# Patient Record
Sex: Female | Born: 1945 | Race: White | Hispanic: No | Marital: Single
Health system: Southern US, Community
[De-identification: ages and names within clinical notes are randomized; demographics above are authoritative.]

## PROBLEM LIST (undated history)

## (undated) DIAGNOSIS — F0280 Dementia in other diseases classified elsewhere without behavioral disturbance: Secondary | ICD-10-CM

## (undated) DIAGNOSIS — N289 Disorder of kidney and ureter, unspecified: Secondary | ICD-10-CM

## (undated) DIAGNOSIS — I10 Essential (primary) hypertension: Secondary | ICD-10-CM

## (undated) DIAGNOSIS — R6339 Other feeding difficulties: Secondary | ICD-10-CM

## (undated) DIAGNOSIS — G309 Alzheimer's disease, unspecified: Secondary | ICD-10-CM

## (undated) DIAGNOSIS — R633 Feeding difficulties: Secondary | ICD-10-CM

## (undated) DIAGNOSIS — M6281 Muscle weakness (generalized): Secondary | ICD-10-CM

## (undated) HISTORY — PX: ABDOMINAL HYSTERECTOMY: SHX81

## (undated) HISTORY — PX: BACK SURGERY: SHX140

## (undated) HISTORY — PX: JOINT REPLACEMENT: SHX530

---

## 2016-05-19 ENCOUNTER — Inpatient Hospital Stay (HOSPITAL_COMMUNITY)
Admission: EM | Admit: 2016-05-19 | Discharge: 2016-05-21 | DRG: 470 | Disposition: A | Discharge status: Skilled Nursing Facility | Payer: Medicare Other | Attending: Internal Medicine | Admitting: Internal Medicine

## 2016-05-19 ENCOUNTER — Emergency Department (HOSPITAL_COMMUNITY): Payer: Medicare Other

## 2016-05-19 ENCOUNTER — Encounter (HOSPITAL_COMMUNITY): Payer: Self-pay | Admitting: Emergency Medicine

## 2016-05-19 DIAGNOSIS — E440 Moderate protein-calorie malnutrition: Secondary | ICD-10-CM | POA: Diagnosis present

## 2016-05-19 DIAGNOSIS — I10 Essential (primary) hypertension: Secondary | ICD-10-CM | POA: Diagnosis present

## 2016-05-19 DIAGNOSIS — F418 Other specified anxiety disorders: Secondary | ICD-10-CM | POA: Diagnosis present

## 2016-05-19 DIAGNOSIS — F0280 Dementia in other diseases classified elsewhere without behavioral disturbance: Secondary | ICD-10-CM | POA: Diagnosis present

## 2016-05-19 DIAGNOSIS — F0390 Unspecified dementia without behavioral disturbance: Secondary | ICD-10-CM | POA: Diagnosis present

## 2016-05-19 DIAGNOSIS — Z7984 Long term (current) use of oral hypoglycemic drugs: Secondary | ICD-10-CM

## 2016-05-19 DIAGNOSIS — M25551 Pain in right hip: Secondary | ICD-10-CM | POA: Diagnosis present

## 2016-05-19 DIAGNOSIS — G309 Alzheimer's disease, unspecified: Secondary | ICD-10-CM | POA: Diagnosis present

## 2016-05-19 DIAGNOSIS — S72001A Fracture of unspecified part of neck of right femur, initial encounter for closed fracture: Secondary | ICD-10-CM | POA: Diagnosis not present

## 2016-05-19 DIAGNOSIS — W19XXXA Unspecified fall, initial encounter: Secondary | ICD-10-CM | POA: Diagnosis not present

## 2016-05-19 DIAGNOSIS — Z01818 Encounter for other preprocedural examination: Secondary | ICD-10-CM | POA: Insufficient documentation

## 2016-05-19 DIAGNOSIS — M6281 Muscle weakness (generalized): Secondary | ICD-10-CM

## 2016-05-19 DIAGNOSIS — G308 Other Alzheimer's disease: Secondary | ICD-10-CM | POA: Diagnosis not present

## 2016-05-19 DIAGNOSIS — Z6821 Body mass index (BMI) 21.0-21.9, adult: Secondary | ICD-10-CM

## 2016-05-19 DIAGNOSIS — S72011A Unspecified intracapsular fracture of right femur, initial encounter for closed fracture: Secondary | ICD-10-CM | POA: Diagnosis present

## 2016-05-19 DIAGNOSIS — E119 Type 2 diabetes mellitus without complications: Secondary | ICD-10-CM | POA: Diagnosis present

## 2016-05-19 DIAGNOSIS — W1830XA Fall on same level, unspecified, initial encounter: Secondary | ICD-10-CM | POA: Diagnosis present

## 2016-05-19 DIAGNOSIS — R2689 Other abnormalities of gait and mobility: Secondary | ICD-10-CM

## 2016-05-19 DIAGNOSIS — F0281 Dementia in other diseases classified elsewhere with behavioral disturbance: Secondary | ICD-10-CM

## 2016-05-19 DIAGNOSIS — Z9889 Other specified postprocedural states: Secondary | ICD-10-CM

## 2016-05-19 HISTORY — DX: Feeding difficulties: R63.3

## 2016-05-19 HISTORY — DX: Disorder of kidney and ureter, unspecified: N28.9

## 2016-05-19 HISTORY — DX: Dementia in other diseases classified elsewhere, unspecified severity, without behavioral disturbance, psychotic disturbance, mood disturbance, and anxiety: F02.80

## 2016-05-19 HISTORY — DX: Muscle weakness (generalized): M62.81

## 2016-05-19 HISTORY — DX: Alzheimer's disease, unspecified: G30.9

## 2016-05-19 HISTORY — DX: Other feeding difficulties: R63.39

## 2016-05-19 HISTORY — DX: Essential (primary) hypertension: I10

## 2016-05-19 LAB — SURGICAL PCR SCREEN
MRSA, PCR: NEGATIVE
Staphylococcus aureus: NEGATIVE

## 2016-05-19 LAB — CBC WITH DIFFERENTIAL/PLATELET
BASOS ABS: 0 10*3/uL (ref 0.0–0.1)
Basophils Relative: 0 %
EOS ABS: 0 10*3/uL (ref 0.0–0.7)
Eosinophils Relative: 0 %
HCT: 37.6 % (ref 36.0–46.0)
HEMOGLOBIN: 12.4 g/dL (ref 12.0–15.0)
LYMPHS ABS: 0.3 10*3/uL — AB (ref 0.7–4.0)
LYMPHS PCT: 4 %
MCH: 29.5 pg (ref 26.0–34.0)
MCHC: 33 g/dL (ref 30.0–36.0)
MCV: 89.5 fL (ref 78.0–100.0)
Monocytes Absolute: 0.7 10*3/uL (ref 0.1–1.0)
Monocytes Relative: 9 %
NEUTROS PCT: 87 %
Neutro Abs: 6.7 10*3/uL (ref 1.7–7.7)
PLATELETS: 152 10*3/uL (ref 150–400)
RBC: 4.2 MIL/uL (ref 3.87–5.11)
RDW: 13.3 % (ref 11.5–15.5)
WBC: 7.7 10*3/uL (ref 4.0–10.5)

## 2016-05-19 LAB — BASIC METABOLIC PANEL
Anion gap: 8 (ref 5–15)
BUN: 21 mg/dL — ABNORMAL HIGH (ref 6–20)
CHLORIDE: 98 mmol/L — AB (ref 101–111)
CO2: 29 mmol/L (ref 22–32)
CREATININE: 0.63 mg/dL (ref 0.44–1.00)
Calcium: 8.9 mg/dL (ref 8.9–10.3)
Glucose, Bld: 158 mg/dL — ABNORMAL HIGH (ref 65–99)
POTASSIUM: 3.6 mmol/L (ref 3.5–5.1)
SODIUM: 135 mmol/L (ref 135–145)

## 2016-05-19 LAB — GLUCOSE, CAPILLARY: Glucose-Capillary: 106 mg/dL — ABNORMAL HIGH (ref 65–99)

## 2016-05-19 LAB — PROTIME-INR
INR: 1.11
PROTHROMBIN TIME: 14.3 s (ref 11.4–15.2)

## 2016-05-19 LAB — CALCIUM: CALCIUM: 8.9 mg/dL (ref 8.9–10.3)

## 2016-05-19 IMAGING — DX DG HIP (WITH OR WITHOUT PELVIS) 2-3V*R*
3 series · 3 of 3 positions shown · non-contrast
Comparison: None.

CLINICAL DATA: Fall

EXAM:
DG HIP (WITH OR WITHOUT PELVIS) 2-3V RIGHT

[hip ap]
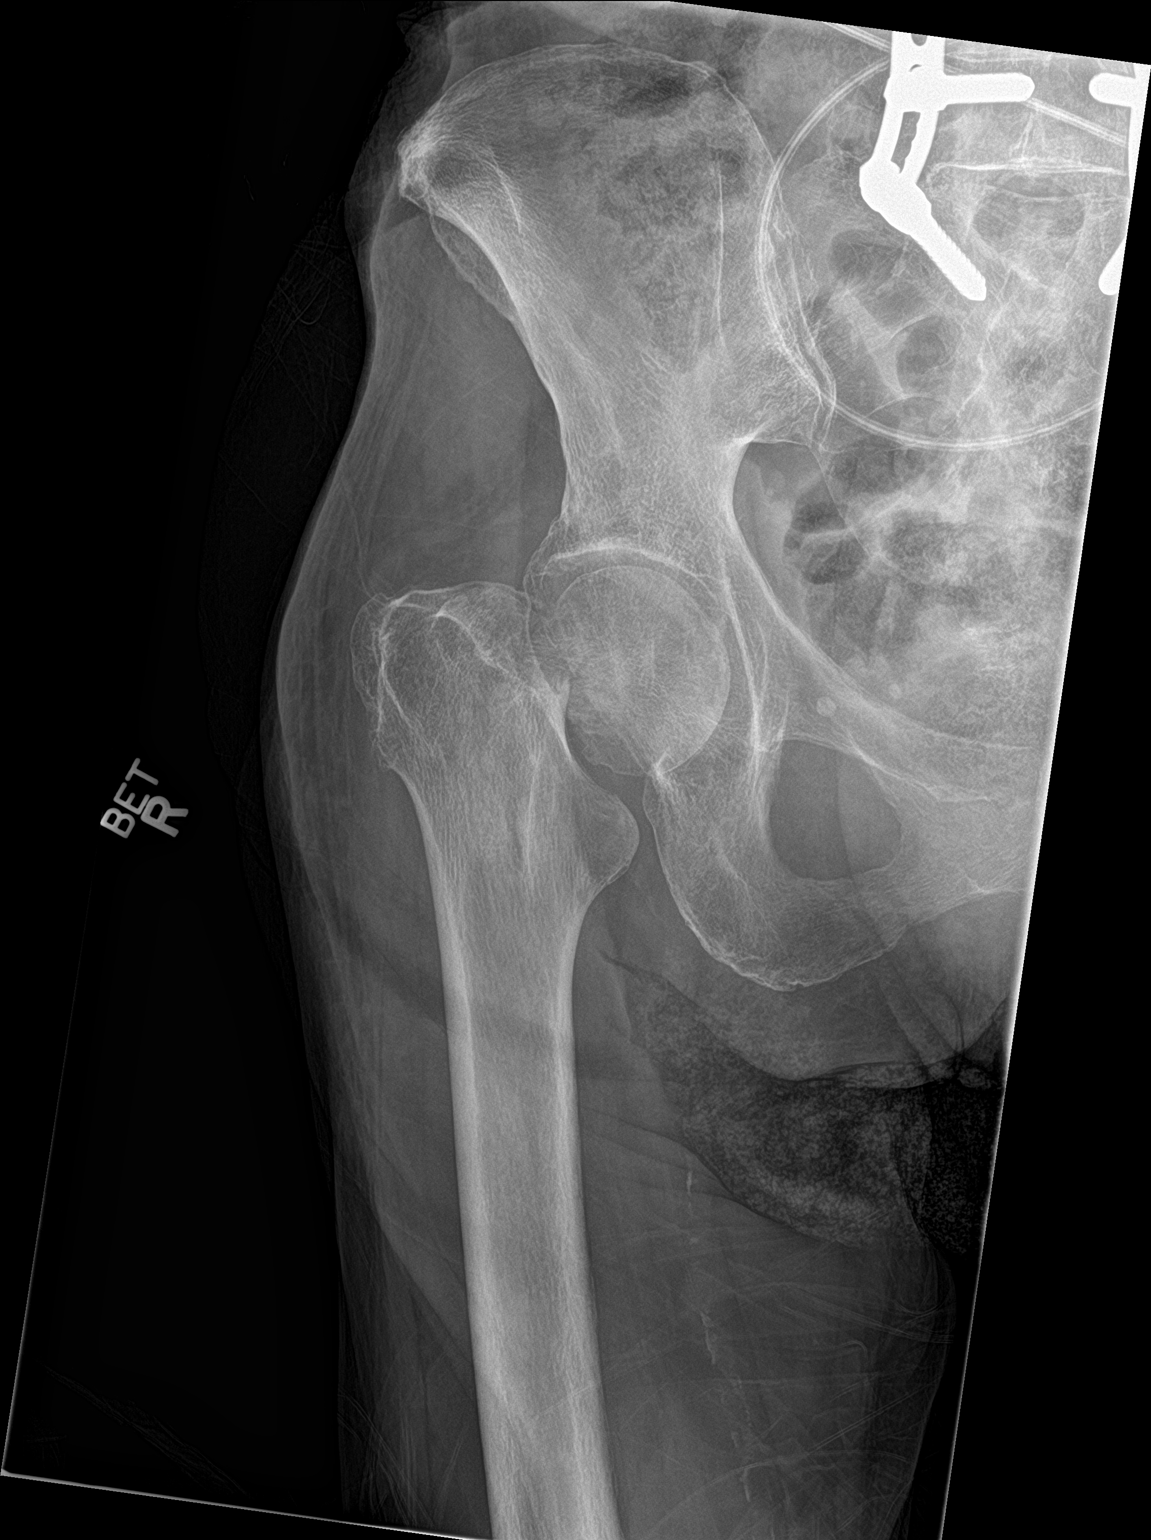

[hip lat]
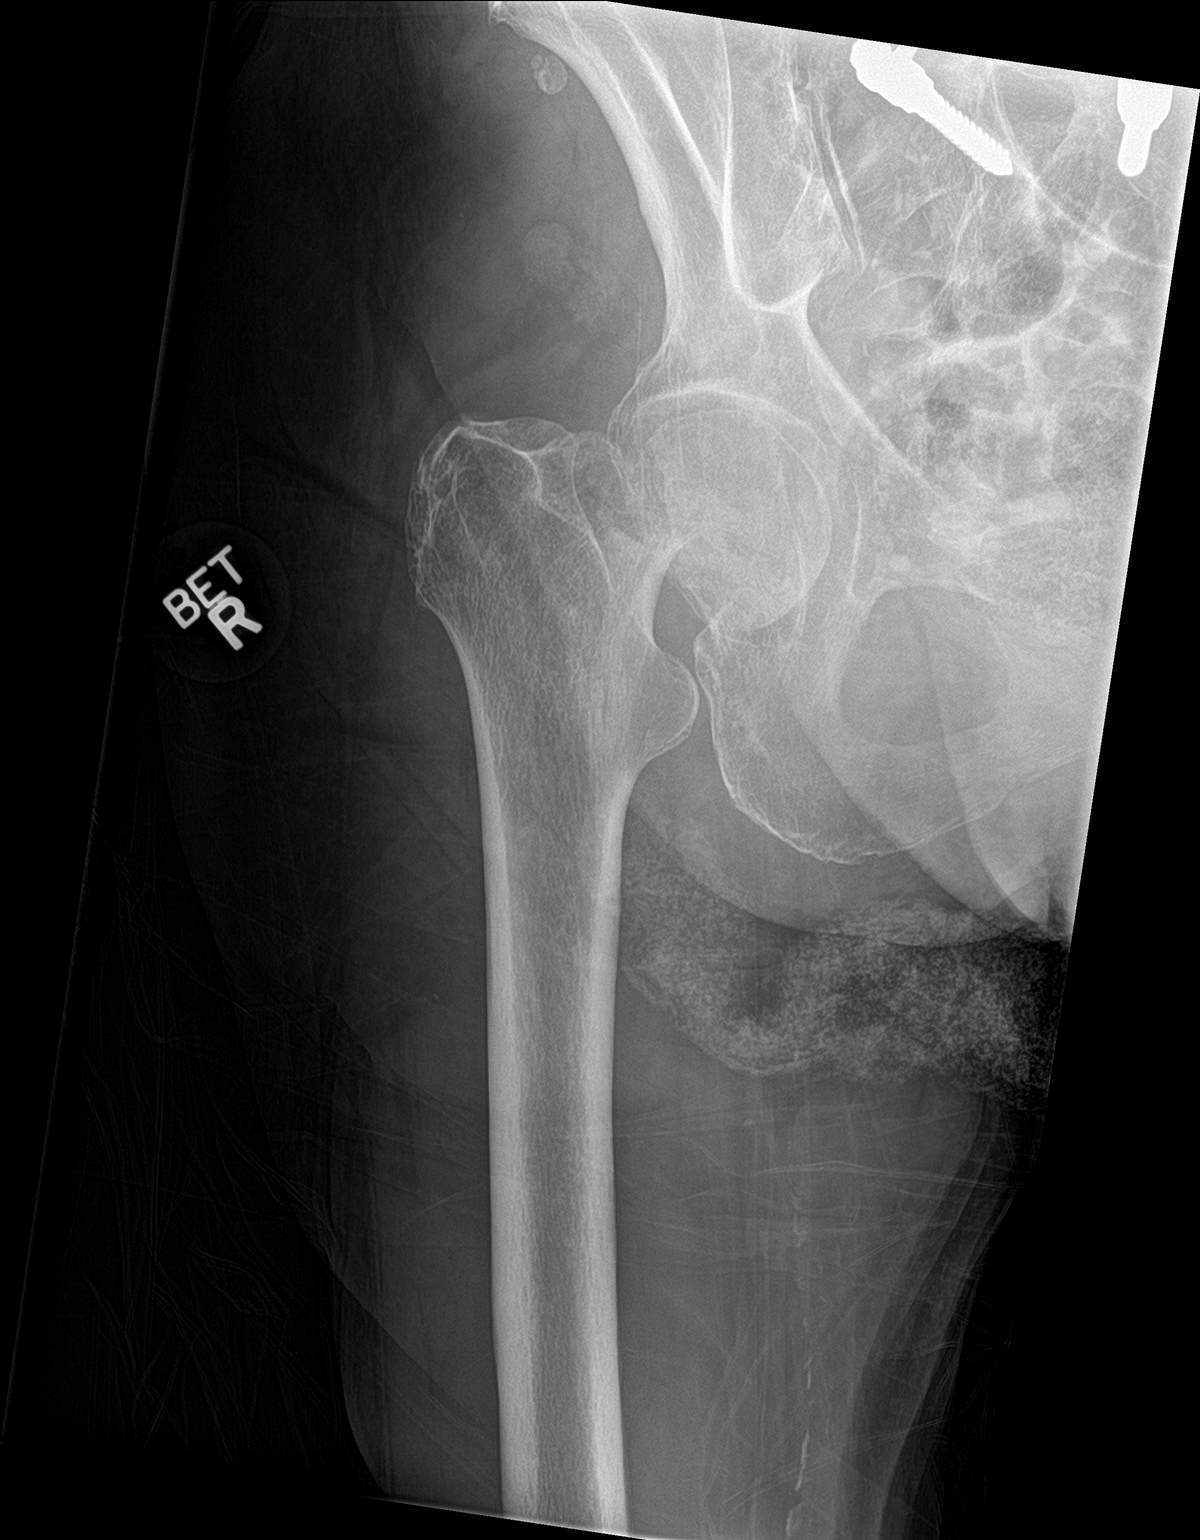

[pelvis ap]
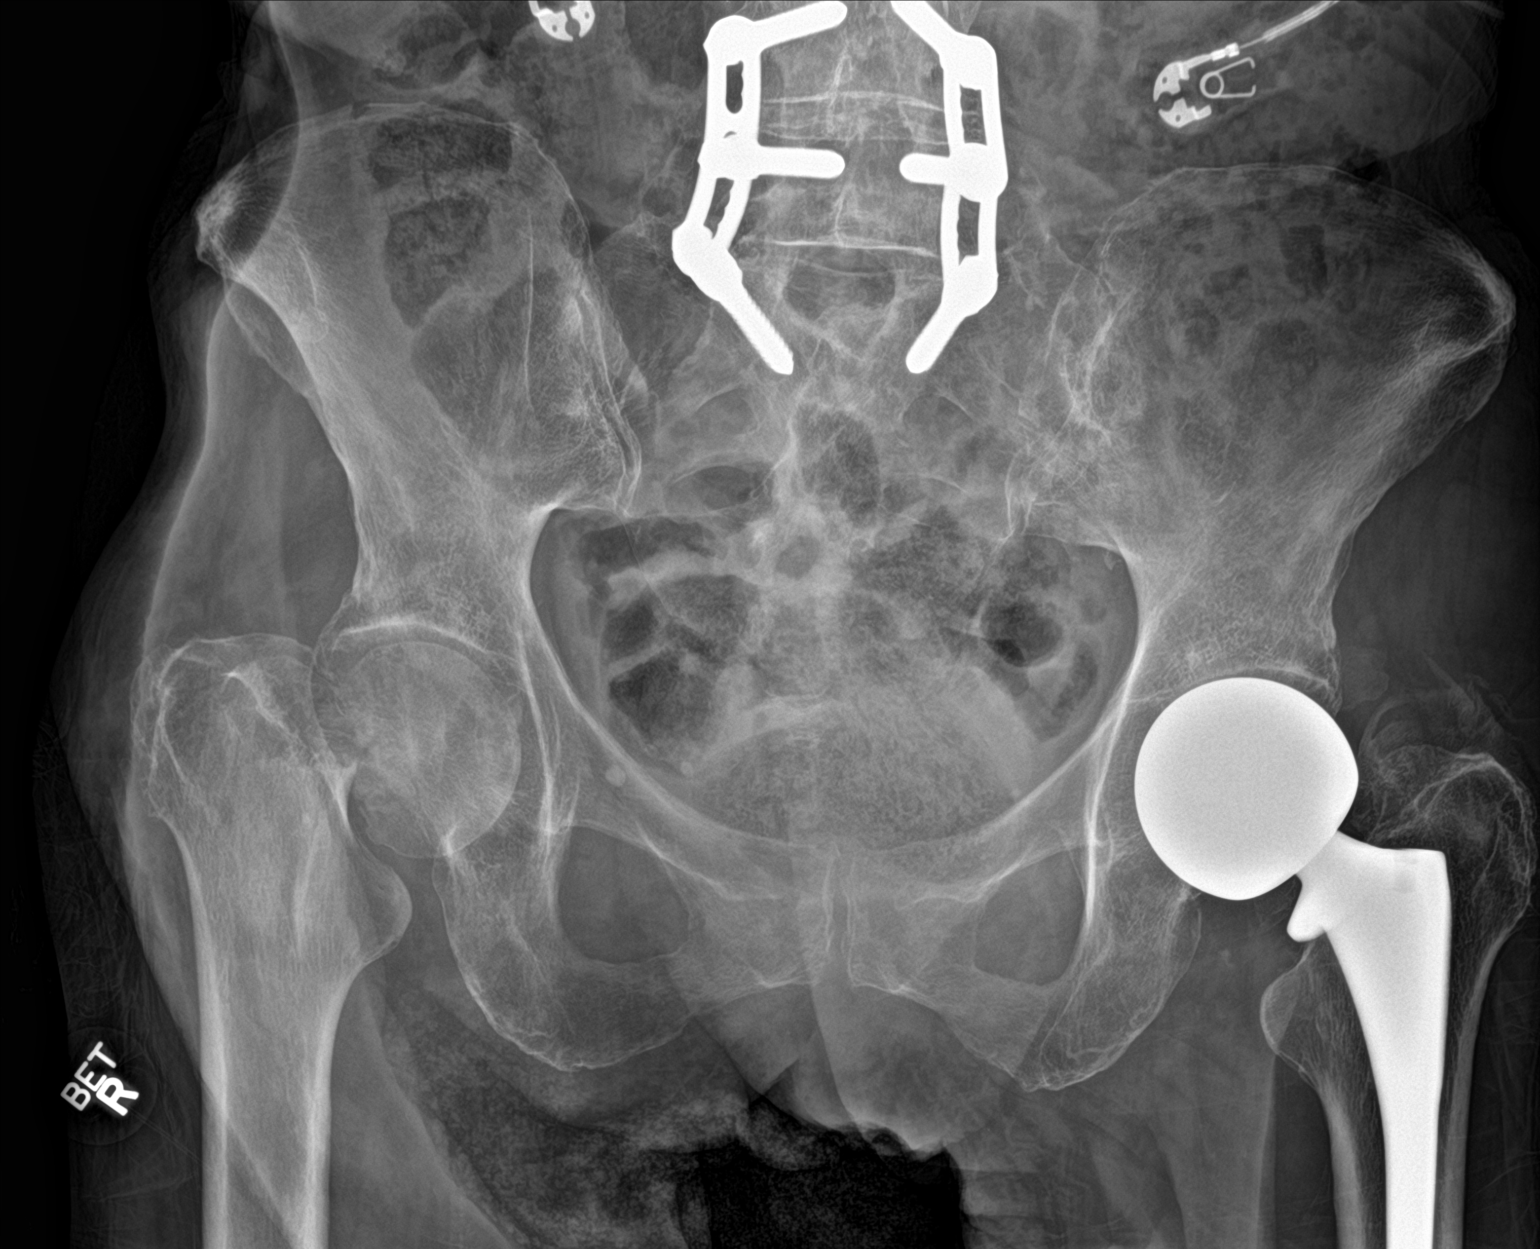

[3 of 3 positions shown; findings below may reference images not displayed]

FINDINGS: There is a markedly displaced subcapital fracture of the right
femoral neck. There is foreshortening of the femur. Osteopenia. Left
hip hemiarthroplasty is in place. Lumbosacral fusion hardware is in
place. Osteopenia.
IMPRESSION: Markedly displaced acute right femoral neck fracture.

## 2016-05-19 IMAGING — DX DG CHEST 1V
1 series · 1 of 1 positions shown · non-contrast
Comparison: None available for comparison at time of study
interpretation.

CLINICAL DATA: Fell last night, RIGHT hip injury. Fell again today
with the RIGHT leg pain in shortening.

EXAM:
CHEST 1 VIEW

[chest pa]
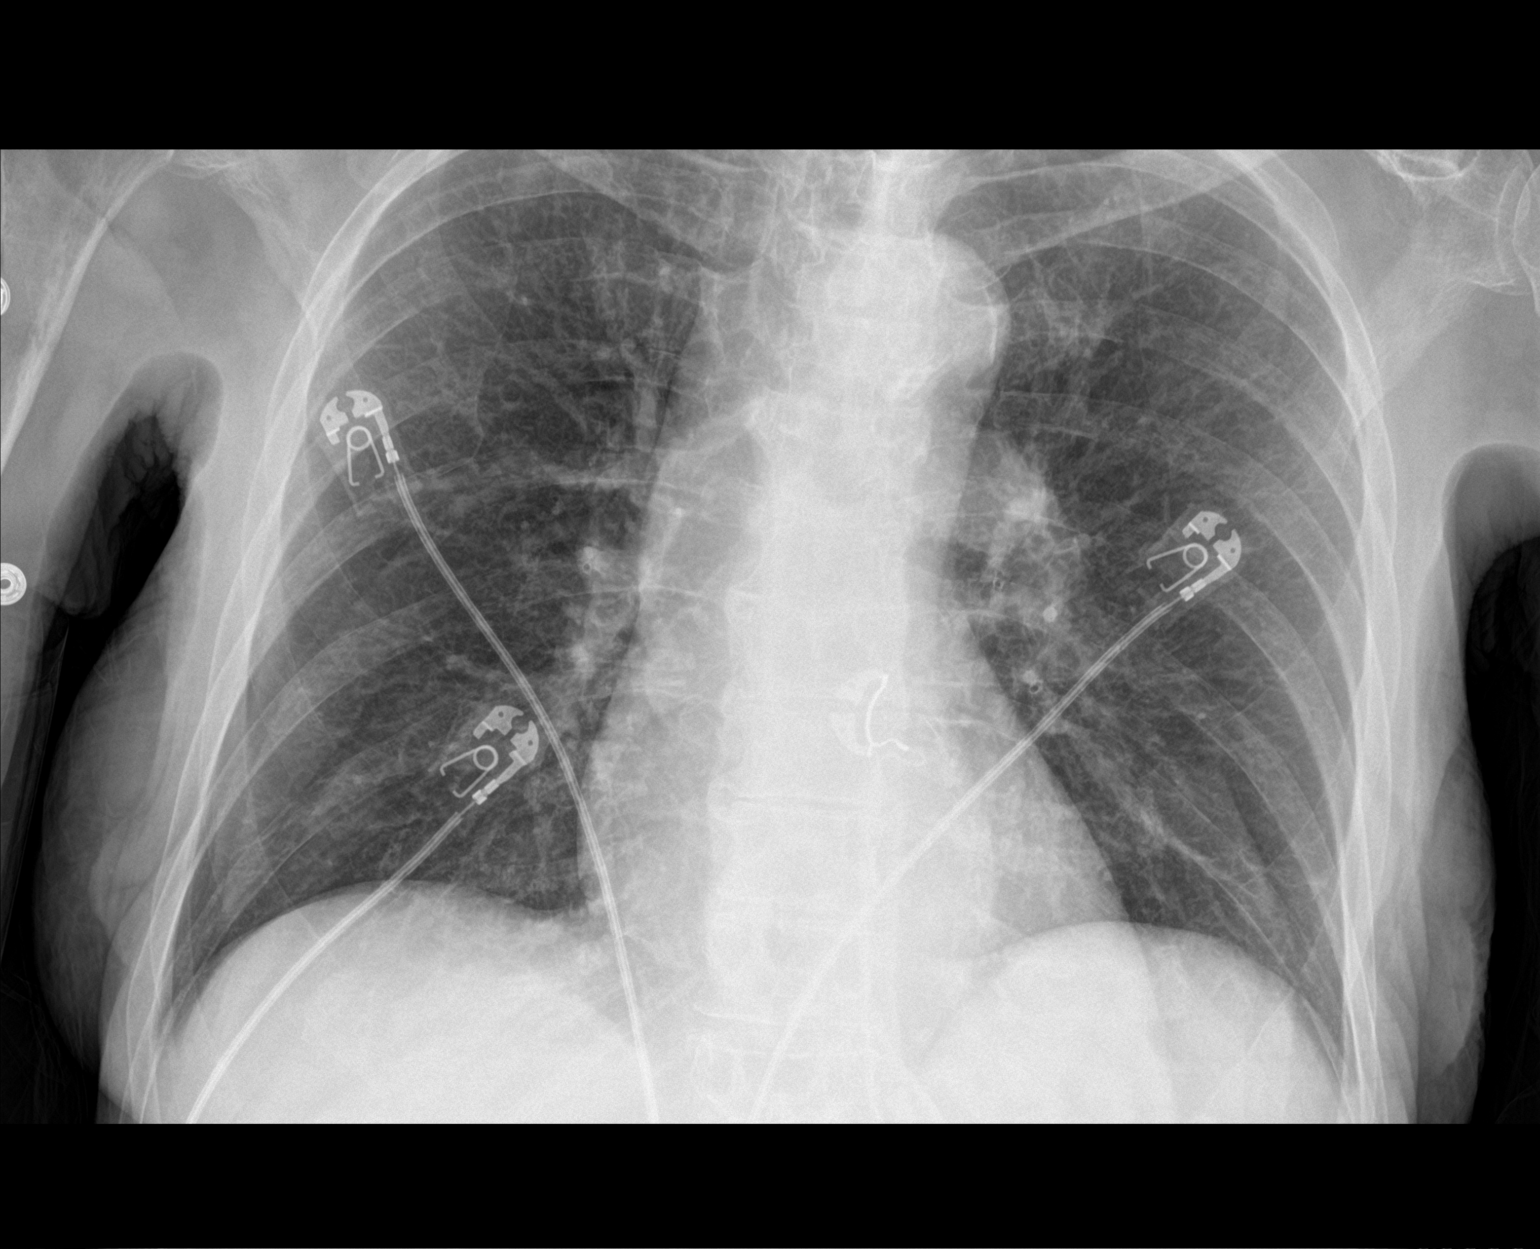

[1 of 1 positions shown; findings below may reference images not displayed]

FINDINGS: Cardiac silhouette is normal. Fullness of LEFT hilum accentuated by
rotation to the RIGHT. Calcified aortic knob. No pleural effusions
or focal consolidations. Trachea projects midline and there is no
pneumothorax. Soft tissue planes and included osseous structures are
non-suspicious.
IMPRESSION: No acute cardiopulmonary process.

Prominent LEFT hilum which could represent vascular structures
and/or lymphadenopathy.

## 2016-05-19 MED ORDER — SODIUM CHLORIDE 0.9 % IV SOLN
INTRAVENOUS | Status: DC
  Administered 2016-05-19: 21:00:00 via INTRAVENOUS

## 2016-05-19 MED ORDER — VITAMIN D 1000 UNITS PO TABS
3000.0000 [IU] | ORAL_TABLET | Freq: Every day | ORAL | Status: DC
  Administered 2016-05-20: 3000 [IU] via ORAL
  Filled 2016-05-19 (×4): qty 3

## 2016-05-19 MED ORDER — DIVALPROEX SODIUM 125 MG PO CSDR
125.0000 mg | DELAYED_RELEASE_CAPSULE | Freq: Two times a day (BID) | ORAL | Status: DC
  Administered 2016-05-20 – 2016-05-21 (×3): 125 mg via ORAL
  Filled 2016-05-19 (×6): qty 1

## 2016-05-19 MED ORDER — SODIUM CHLORIDE 0.9 % IV SOLN: Freq: Once | INTRAVENOUS | Status: DC

## 2016-05-19 MED ORDER — DONEPEZIL HCL 5 MG PO TABS
10.0000 mg | ORAL_TABLET | Freq: Every day | ORAL | Status: DC
  Administered 2016-05-19 – 2016-05-20 (×2): 10 mg via ORAL
  Filled 2016-05-19 (×2): qty 2

## 2016-05-19 MED ORDER — HYDRALAZINE HCL 20 MG/ML IJ SOLN: 5.0000 mg | INTRAMUSCULAR | Status: DC | PRN

## 2016-05-19 MED ORDER — INSULIN ASPART 100 UNIT/ML ~~LOC~~ SOLN
0.0000 [IU] | SUBCUTANEOUS | Status: DC
  Administered 2016-05-19 – 2016-05-21 (×4): 2 [IU] via SUBCUTANEOUS
  Administered 2016-05-21: 1 [IU] via SUBCUTANEOUS

## 2016-05-19 MED ORDER — HYDROCODONE-ACETAMINOPHEN 5-325 MG PO TABS: 1.0000 | ORAL_TABLET | Freq: Four times a day (QID) | ORAL | Status: DC | PRN

## 2016-05-19 MED ORDER — DEXTROSE 5 % IV SOLN
500.0000 mg | Freq: Four times a day (QID) | INTRAVENOUS | Status: DC | PRN
  Filled 2016-05-19: qty 5

## 2016-05-19 MED ORDER — SODIUM CHLORIDE 0.9 % IV SOLN
Freq: Once | INTRAVENOUS | Status: AC
  Administered 2016-05-19: 17:00:00 via INTRAVENOUS

## 2016-05-19 MED ORDER — DOCUSATE SODIUM 100 MG PO CAPS
100.0000 mg | ORAL_CAPSULE | Freq: Two times a day (BID) | ORAL | Status: DC
  Administered 2016-05-19 – 2016-05-21 (×4): 100 mg via ORAL
  Filled 2016-05-19 (×4): qty 1

## 2016-05-19 MED ORDER — MEMANTINE HCL 10 MG PO TABS
10.0000 mg | ORAL_TABLET | Freq: Two times a day (BID) | ORAL | Status: DC
  Administered 2016-05-19 – 2016-05-21 (×4): 10 mg via ORAL
  Filled 2016-05-19 (×4): qty 1

## 2016-05-19 MED ORDER — SERTRALINE HCL 50 MG PO TABS
100.0000 mg | ORAL_TABLET | Freq: Every day | ORAL | Status: DC
Start: 2016-05-20 — End: 2016-05-21
  Administered 2016-05-20 – 2016-05-21 (×2): 100 mg via ORAL
  Filled 2016-05-19 (×2): qty 2

## 2016-05-19 MED ORDER — HEPARIN SODIUM (PORCINE) 5000 UNIT/ML IJ SOLN
5000.0000 [IU] | Freq: Three times a day (TID) | INTRAMUSCULAR | Status: AC
  Administered 2016-05-19: 5000 [IU] via SUBCUTANEOUS
  Filled 2016-05-19: qty 1

## 2016-05-19 MED ORDER — LORAZEPAM 2 MG/ML IJ SOLN
0.5000 mg | INTRAMUSCULAR | Status: DC | PRN
  Administered 2016-05-21: 0.5 mg via INTRAVENOUS
  Filled 2016-05-19: qty 1

## 2016-05-19 MED ORDER — METHOCARBAMOL 500 MG PO TABS: 500.0000 mg | ORAL_TABLET | Freq: Four times a day (QID) | ORAL | Status: DC | PRN

## 2016-05-19 MED ORDER — MORPHINE SULFATE (PF) 2 MG/ML IV SOLN
0.5000 mg | INTRAVENOUS | Status: DC | PRN
Start: 2016-05-19 — End: 2016-05-21
  Administered 2016-05-20 – 2016-05-21 (×3): 0.5 mg via INTRAVENOUS
  Filled 2016-05-19 (×3): qty 1

## 2016-05-19 MED ORDER — FENTANYL CITRATE (PF) 100 MCG/2ML IJ SOLN
50.0000 ug | INTRAMUSCULAR | Status: DC | PRN
  Administered 2016-05-19: 50 ug via INTRAVENOUS
  Filled 2016-05-19: qty 2

## 2016-05-19 MED ORDER — ALPRAZOLAM 0.25 MG PO TABS: 0.2500 mg | ORAL_TABLET | Freq: Three times a day (TID) | ORAL | Status: DC | PRN

## 2016-05-19 NOTE — ED Notes (Signed)
Pt c/o pain in r leg.  Notified edp and meds ordered.  Pt's depends, wet, pt cleaned.

## 2016-05-19 NOTE — ED Triage Notes (Signed)
Pt seen at [facility] last night for a fall and R hip injury. Pt was dx with contusion per EMS. Pt fell again today and has external rotation and shortening of the R leg.

## 2016-05-19 NOTE — ED Provider Notes (Signed)
[facility]-EMERGENCY DEPT Provider Note   CSN: [id number] Arrival date & time: 05/19/16  1544     History   Chief Complaint Chief Complaint  Patient presents with  . Fall    HPI [name] is a 70 y.o. female.  Patient with history of dementia Alzheimer's, high blood pressure, weakness, [facility] presents after unwitnessed fall with shortened and rotated right leg. Unknown details. No family at bedside. Patient reportedly at baseline.      Past Medical History:  Diagnosis Date  . Alzheimer's dementia   . Feeding problems   . Hypertension   . Muscle weakness   . Renal disorder     Patient Active Problem List   Diagnosis Date Noted  . Dementia 05/19/2016  . Hypertension 05/19/2016  . Closed displaced fracture of right femoral neck (HCC) 05/19/2016  . Depression with anxiety 05/19/2016  . Non-insulin dependent type 2 diabetes mellitus (HCC) 05/19/2016  . Closed right hip fracture, initial encounter (HCC) 05/19/2016    History reviewed. No pertinent surgical history.  OB History    No data available       Home Medications    Prior to Admission medications   Medication Sig Start Date End Date Taking? Authorizing Provider  cholecalciferol (VITAMIN D) 1000 units tablet Take 3,000 Units by mouth daily.   Yes Historical Provider, MD  divalproex (DEPAKOTE SPRINKLE) 125 MG capsule Take 125 mg by mouth 2 (two) times daily. 05/02/16  Yes Historical Provider, MD  donepezil (ARICEPT) 10 MG tablet Take 10 mg by mouth at bedtime. 05/10/16  Yes Historical Provider, MD  Lactobacillus Rhamnosus, GG, (CULTURELLE) CAPS Take 1 capsule by mouth daily.   Yes Historical Provider, MD  lisinopril (PRINIVIL,ZESTRIL) 5 MG tablet Take 5 mg by mouth daily. 05/06/16  Yes Historical Provider, MD  memantine (NAMENDA) 10 MG tablet Take 10 mg by mouth 2 (two) times daily. 05/13/16  Yes Historical Provider, MD  metFORMIN (GLUCOPHAGE) 500 MG tablet Take 500 mg by mouth 2 (two) times  daily. 04/28/16  Yes Historical Provider, MD  mupirocin ointment (BACTROBAN) 2 % Apply 1 application topically every 12 (twelve) hours. 05/17/16  Yes Historical Provider, MD  sertraline (ZOLOFT) 100 MG tablet Take 100 mg by mouth daily. 05/12/16  Yes Historical Provider, MD  ALPRAZolam Prudy Feeler(XANAX) 0.25 MG tablet Take 0.25 mg by mouth every 8 (eight) hours as needed for anxiety.  04/30/16   Historical Provider, MD    Family History Family History  Problem Relation Age of Onset  . Family history unknown: Yes    Social History Social History  Substance Use Topics  . Smoking status: Unknown If Ever Smoked  . Smokeless tobacco: Not on file  . Alcohol use No     Allergies   Patient has no known allergies.   Review of Systems Review of Systems  Unable to perform ROS: Dementia     Physical Exam Updated Vital Signs BP 158/80 (BP Location: Right Arm)   Pulse 91   Temp 99.9 F (37.7 C) (Oral)   Resp 18   Ht 5\' 3"  (1.6 m)   Wt 125 lb (56.7 kg)   SpO2 98%   BMI 22.14 kg/m   Physical Exam  Constitutional: She appears well-developed. No distress.  HENT:  Head: Normocephalic and atraumatic.  Eyes: Pupils are equal, round, and reactive to light.  Neck: Normal range of motion. Neck supple.  Cardiovascular: Normal rate.   Pulmonary/Chest: Effort normal and breath sounds normal.  Abdominal: Soft.  There is no tenderness.  Musculoskeletal: She exhibits tenderness and deformity.  Patient has shortened Rotated right extremity 2+ distal pulses no edema to the right leg tenderness with external rotation.  Neurological: She is alert.  Skin: Skin is warm. Capillary refill takes less than 2 seconds.  Psychiatric:  Pleasant dementia  Nursing note and vitals reviewed.    ED Treatments / Results  Labs (all labs ordered are listed, but only abnormal results are displayed) Labs Reviewed  BASIC METABOLIC PANEL - Abnormal; Notable for the following:       Result Value   Chloride 98 (*)      Glucose, Bld 158 (*)    BUN 21 (*)    All other components within normal limits  CBC WITH DIFFERENTIAL/PLATELET - Abnormal; Notable for the following:    Lymphs Abs 0.3 (*)    All other components within normal limits  PROTIME-INR  CBC  CREATININE, SERUM  URINALYSIS, ROUTINE W REFLEX MICROSCOPIC (NOT AT [facility])  CALCIUM  VITAMIN D 25 HYDROXY (VIT D DEFICIENCY, FRACTURES)  BASIC METABOLIC PANEL  TYPE AND SCREEN    EKG  EKG Interpretation None       Radiology Dg Chest 1 View  Result Date: 05/19/2016 CLINICAL DATA:  Larey SeatFell last night, RIGHT hip injury. Larey SeatFell again today with the RIGHT leg pain in shortening. EXAM: CHEST 1 VIEW COMPARISON:  None available for comparison at time of study interpretation. FINDINGS: Cardiac silhouette is normal. Fullness of LEFT hilum accentuated by rotation to the RIGHT. Calcified aortic knob. No pleural effusions or focal consolidations. Trachea projects midline and there is no pneumothorax. Soft tissue planes and included osseous structures are non-suspicious. IMPRESSION: No acute cardiopulmonary process. Prominent LEFT hilum which could represent vascular structures and/or lymphadenopathy. Electronically Signed   By: Awilda Metroourtnay  Bloomer M.D.   On: 05/19/2016 17:58   Dg Hip Unilat With Pelvis 2-3 Views Right  Result Date: 05/19/2016 CLINICAL DATA:  Fall EXAM: DG HIP (WITH OR WITHOUT PELVIS) 2-3V RIGHT COMPARISON:  None. FINDINGS: There is a markedly displaced subcapital fracture of the right femoral neck. There is foreshortening of the femur. Osteopenia. Left hip hemiarthroplasty is in place. Lumbosacral fusion hardware is in place. Osteopenia. IMPRESSION: Markedly displaced acute right femoral neck fracture. Electronically Signed   By: Jolaine ClickArthur  Hoss M.D.   On: 05/19/2016 17:51    Procedures Procedures (including critical care time)  Medications Ordered in ED Medications  ALPRAZolam (XANAX) tablet 0.25 mg (not administered)  cholecalciferol (VITAMIN D)  tablet 3,000 Units (not administered)  divalproex (DEPAKOTE SPRINKLE) capsule 125 mg (not administered)  donepezil (ARICEPT) tablet 10 mg (not administered)  memantine (NAMENDA) tablet 10 mg (not administered)  sertraline (ZOLOFT) tablet 100 mg (not administered)  HYDROcodone-acetaminophen (NORCO/VICODIN) 5-325 MG per tablet 1-2 tablet (not administered)  morphine 2 MG/ML injection 0.5 mg (not administered)  insulin aspart (novoLOG) injection 0-9 Units (not administered)  heparin injection 5,000 Units (not administered)  methocarbamol (ROBAXIN) tablet 500 mg (not administered)    Or  methocarbamol (ROBAXIN) 500 mg in dextrose 5 % 50 mL IVPB (not administered)  docusate sodium (COLACE) capsule 100 mg (not administered)  LORazepam (ATIVAN) injection 0.5 mg (not administered)  hydrALAZINE (APRESOLINE) injection 5 mg (not administered)  0.9 %  sodium chloride infusion (not administered)  0.9 %  sodium chloride infusion (0 mL/hr Intravenous Stopped 05/19/16 1826)     Initial Impression / Assessment and Plan / ED Course  I have reviewed the triage vital signs and the nursing  notes.  Pertinent labs & imaging results that were available during my care of the patient were reviewed by me and considered in my medical decision making (see chart for details).  Clinical Course    Patient resents after unwitnessed fall at baseline per dementia report. Concern clinically for right hip fracture/dislocation. Plan for x-ray, blood work and likely admission with Ortho consult. Spoke with Dr. Lajoyce Corners recommended triad admission and stated [facility]. The patients results and plan were reviewed and discussed.   Any x-rays performed were independently reviewed by myself.   Differential diagnosis were considered with the presenting HPI.  Medications  ALPRAZolam (XANAX) tablet 0.25 mg (not administered)  cholecalciferol (VITAMIN D) tablet 3,000 Units (not administered)  divalproex (DEPAKOTE SPRINKLE)  capsule 125 mg (not administered)  donepezil (ARICEPT) tablet 10 mg (not administered)  memantine (NAMENDA) tablet 10 mg (not administered)  sertraline (ZOLOFT) tablet 100 mg (not administered)  HYDROcodone-acetaminophen (NORCO/VICODIN) 5-325 MG per tablet 1-2 tablet (not administered)  morphine 2 MG/ML injection 0.5 mg (not administered)  insulin aspart (novoLOG) injection 0-9 Units (not administered)  heparin injection 5,000 Units (not administered)  methocarbamol (ROBAXIN) tablet 500 mg (not administered)    Or  methocarbamol (ROBAXIN) 500 mg in dextrose 5 % 50 mL IVPB (not administered)  docusate sodium (COLACE) capsule 100 mg (not administered)  LORazepam (ATIVAN) injection 0.5 mg (not administered)  hydrALAZINE (APRESOLINE) injection 5 mg (not administered)  0.9 %  sodium chloride infusion (not administered)  0.9 %  sodium chloride infusion (0 mL/hr Intravenous Stopped 05/19/16 1826)    Vitals:   05/19/16 1551 05/19/16 1554 05/19/16 1809  BP:  154/80 158/80  Pulse:  85 91  Resp:  20 18  Temp:  98.6 F (37 C) 99.9 F (37.7 C)  TempSrc:  Oral Oral  SpO2:  100% 98%  Weight: 125 lb (56.7 kg)    Height: 5\' 3"  (1.6 m)      Final diagnoses:  Preop testing  Closed right hip fracture, initial encounter (HCC)  Fall, initial encounter    Admission/ observation were discussed with the admitting physician, patient and/or family and they are comfortable with the plan.    Final Clinical Impressions(s) / ED Diagnoses   Final diagnoses:  Preop testing  Closed right hip fracture, initial encounter [facility])  Fall, initial encounter    New Prescriptions New Prescriptions   No medications on file     Blane Ohara, MD 05/19/16 1858

## 2016-05-19 NOTE — ED Notes (Signed)
Pt resident of [facility]. Has history of alzheimer's disease.  Triage reports she fell and was treated at [facility] and was told she had a contusion of r hip.  Reports fell again and now has shortening and external rotation of r leg.  Pedal pulse present.  Skin warm and dry.  Pt alert but confused and will not obey commands.

## 2016-05-19 NOTE — ED Notes (Signed)
Attempted to call report on pt to 3rd floor but RN in report at this time.

## 2016-05-19 NOTE — ED Notes (Signed)
Patient transported to the floor

## 2016-05-19 NOTE — H&P (Signed)
History and Physical    Misty KaplanBarbara Mckee ZOX:[id number] DOB: Nov 19, 1945 DOA: 05/19/2016  PCP: No PCP Per Patient   Patient coming from: SNF  Chief Complaint: Right hip pain with deformity after unwitnessed fall   HPI: [name] is a 70 y.o. female with medical history significant for advanced Alzheimer's dementia, hypertension, depression with anxiety, and type 2 diabetes mellitus who presents from her SNF for evaluation of right hip pain with gross deformity following an unwitnessed fall. History is obtained through discussion with the ED and SNF personnel and review of the EMR as patient is limited by advanced dementia. She had reportedly been evaluated at a [facility] the night prior to this admission after a fall. She reportedly had right hip pain at that time but imaging was reported as negative. She apparently suffered a second fall today and was noted to have shortening and external rotation of the right leg. She complained of right hip pain and was unable to bear weight. She had seemed to be in her usual state prior to these falls with no recent fevers or chills and no recent cough, vomiting, or diarrhea. Other than right hip pain, the patient has not voiced any complaints. There has been no apparent head injury and no loss of consciousness has been reported. The patient is not anticoagulated. Surgical history, if any, is unknown.  ED Course: Upon arrival to the ED, patient is found to be afebrile, saturating well on room air, and with vitals otherwise stable. EKG demonstrates a sinus rhythm with low voltage QRS and chest x-ray is negative for acute cardiopulmonary disease. Radiographs of the hip demonstrate a markedly displaced acute right femoral neck fracture. Chemistry panel is notable for an elevated BUN to creatinine ratio and CBC is unremarkable. INR is within the normal limits and urinalysis remains pending. Type and screen was performed in the emergency department and  orthopedic surgery was consulted by the ED physician. The orthopedist recommends a medical admission to independent hospital and will plan to see the patient in the morning. Patient was treated with fentanyl in the ED, remained hemodynamically stable and in no respiratory distress, and will be admitted to the medical-surgical unit for ongoing evaluation and management of acute displaced fracture of the right hip.   Review of Systems:  Unable to obtain ROS secondary to patient's clinical condition with advanced dementia.  Past Medical History:  Diagnosis Date  . Alzheimer's dementia   . Feeding problems   . Hypertension   . Muscle weakness   . Renal disorder     History reviewed. No pertinent surgical history.   reports that she does not drink alcohol. Her tobacco and drug histories are not on file.  No Known Allergies  Family History  Problem Relation Age of Onset  . Family history unknown: Yes     Prior to Admission medications   Medication Sig Start Date End Date Taking? Authorizing Provider  cholecalciferol (VITAMIN D) 1000 units tablet Take 3,000 Units by mouth daily.   Yes Historical Provider, MD  divalproex (DEPAKOTE SPRINKLE) 125 MG capsule Take 125 mg by mouth 2 (two) times daily. 05/02/16  Yes Historical Provider, MD  donepezil (ARICEPT) 10 MG tablet Take 10 mg by mouth at bedtime. 05/10/16  Yes Historical Provider, MD  Lactobacillus Rhamnosus, GG, (CULTURELLE) CAPS Take 1 capsule by mouth daily.   Yes Historical Provider, MD  lisinopril (PRINIVIL,ZESTRIL) 5 MG tablet Take 5 mg by mouth daily. 05/06/16  Yes Historical Provider, MD  memantine (  NAMENDA) 10 MG tablet Take 10 mg by mouth 2 (two) times daily. 05/13/16  Yes Historical Provider, MD  metFORMIN (GLUCOPHAGE) 500 MG tablet Take 500 mg by mouth 2 (two) times daily. 04/28/16  Yes Historical Provider, MD  mupirocin ointment (BACTROBAN) 2 % Apply 1 application topically every 12 (twelve) hours. 05/17/16  Yes Historical  Provider, MD  sertraline (ZOLOFT) 100 MG tablet Take 100 mg by mouth daily. 05/12/16  Yes Historical Provider, MD  ALPRAZolam Prudy Feeler(XANAX) 0.25 MG tablet Take 0.25 mg by mouth every 8 (eight) hours as needed for anxiety.  04/30/16   Historical Provider, MD    Physical Exam: Vitals:   05/19/16 1551 05/19/16 1554 05/19/16 1809  BP:  154/80 158/80  Pulse:  85 91  Resp:  20 18  Temp:  98.6 F (37 C) 99.9 F (37.7 C)  TempSrc:  Oral Oral  SpO2:  100% 98%  Weight: 56.7 kg (125 lb)    Height: 5\' 3"  (1.6 m)        Constitutional: NAD, calm, comfortable Eyes: PERTLA, lids and conjunctivae normal ENMT: Mucous membranes are moist. Posterior pharynx clear of any exudate or lesions.   Neck: normal, supple, no masses, no thyromegaly Respiratory: clear to auscultation bilaterally, no wheezing, no crackles. Normal respiratory effort.   Cardiovascular: S1 & S2 heard, regular rate and rhythm, no significant murmur. No extremity edema. 2+ pedal pulses. No carotid bruits. No significant JVD. Abdomen: No distension, no tenderness, no masses palpated. Bowel sounds normal.  Musculoskeletal: no clubbing / cyanosis. Right leg shortened and externally rotated; no significant ecchymosis; neurovascularly intact distally. Normal muscle tone.  Skin: no significant rashes, lesions, ulcers. Xerosis, poor turgor. Warm, dry, well-perfused. Neurologic: CN 2-12 grossly intact. Sensation intact, bicipital DTRs normal. UE strength 5/5 in all 4 limbs.  Psychiatric: Alert, but unable to respond appropriately to basic questioning, will not state name. Calm, pleasant.     Labs on Admission: I have personally reviewed following labs and imaging studies  CBC:  Recent Labs Lab 05/19/16 1716  WBC 7.7  NEUTROABS 6.7  HGB 12.4  HCT 37.6  MCV 89.5  PLT 152   Basic Metabolic Panel:  Recent Labs Lab 05/19/16 1716  NA 135  K 3.6  CL 98*  CO2 29  GLUCOSE 158*  BUN 21*  CREATININE 0.63  CALCIUM 8.9    GFR: Estimated Creatinine Clearance: 55.7 mL/min (by C-G formula based on SCr of 0.63 mg/dL). Liver Function Tests: No results for input(s): AST, ALT, ALKPHOS, BILITOT, PROT, ALBUMIN in the last 168 hours. No results for input(s): LIPASE, AMYLASE in the last 168 hours. No results for input(s): AMMONIA in the last 168 hours. Coagulation Profile:  Recent Labs Lab 05/19/16 1716  INR 1.11   Cardiac Enzymes: No results for input(s): CKTOTAL, CKMB, CKMBINDEX, TROPONINI in the last 168 hours. BNP (last 3 results) No results for input(s): PROBNP in the last 8760 hours. HbA1C: No results for input(s): HGBA1C in the last 72 hours. CBG: No results for input(s): GLUCAP in the last 168 hours. Lipid Profile: No results for input(s): CHOL, HDL, LDLCALC, TRIG, CHOLHDL, LDLDIRECT in the last 72 hours. Thyroid Function Tests: No results for input(s): TSH, T4TOTAL, FREET4, T3FREE, THYROIDAB in the last 72 hours. Anemia Panel: No results for input(s): VITAMINB12, FOLATE, FERRITIN, TIBC, IRON, RETICCTPCT in the last 72 hours. Urine analysis: No results found for: COLORURINE, APPEARANCEUR, LABSPEC, PHURINE, GLUCOSEU, HGBUR, BILIRUBINUR, KETONESUR, PROTEINUR, UROBILINOGEN, NITRITE, LEUKOCYTESUR Sepsis Labs: @LABRCNTIP (procalcitonin:4,lacticidven:4) )No results found for this  or any previous visit (from the past 240 hour(s)).   Radiological Exams on Admission: Dg Chest 1 View  Result Date: 05/19/2016 CLINICAL DATA:  Larey Seat last night, RIGHT hip injury. Larey Seat again today with the RIGHT leg pain in shortening. EXAM: CHEST 1 VIEW COMPARISON:  None available for comparison at time of study interpretation. FINDINGS: Cardiac silhouette is normal. Fullness of LEFT hilum accentuated by rotation to the RIGHT. Calcified aortic knob. No pleural effusions or focal consolidations. Trachea projects midline and there is no pneumothorax. Soft tissue planes and included osseous structures are non-suspicious.  IMPRESSION: No acute cardiopulmonary process. Prominent LEFT hilum which could represent vascular structures and/or lymphadenopathy. Electronically Signed   By: Awilda Metro M.D.   On: 05/19/2016 17:58   Dg Hip Unilat With Pelvis 2-3 Views Right  Result Date: 05/19/2016 CLINICAL DATA:  Fall EXAM: DG HIP (WITH OR WITHOUT PELVIS) 2-3V RIGHT COMPARISON:  None. FINDINGS: There is a markedly displaced subcapital fracture of the right femoral neck. There is foreshortening of the femur. Osteopenia. Left hip hemiarthroplasty is in place. Lumbosacral fusion hardware is in place. Osteopenia. IMPRESSION: Markedly displaced acute right femoral neck fracture. Electronically Signed   By: Jolaine Click M.D.   On: 05/19/2016 17:51    EKG: Independently reviewed. Sinus rhythm, low-voltage QRS  Assessment/Plan  1. Acute displaced right femoral neck fracture  - Pt has reportedly suffered an unwitnessed fall at her SNF just PTA  - There is no evidence for head injury and no focal neurologic deficits  - Radiographs reveal a markedly displaced acute fracture involving right femoral neck  - Dr. Lajoyce Corners of orthopedic surgery is consulting and much appreciated  - History is quite limited, but there does not appear to be any arrhythmia and there are no signs of heart failure or significant pulmonary disease - EKG and CXR performed in ED, reviewed; INR wnl; type and screen completed; UA remains pending, empiric treatment advised if suggestive of infxn - Pt appears dehydrated on admission; plan for gentle IVF hydration, pain-control, manage sugars and BP, follow-up UA  - Based on the available data, it is my opinion that Ms. Olshefski presents a low-risk for the proposed surgery   2. Hypertension    - At goal currently  - Managed with lisinopril at home; this will be held in preparation for non-cardiac surgery  - Manage with prn hydralazine IVPs for now    3. Type II DM  - No A1c on file; managed with metformin only  at the SNF  - Hold metformin while in hospital  - Check CBG q4h while NPO for surgery and use a low-intensity SSI correctional as needed    4. Dementia  - Continue Namenda   5. Depression, anxiety  - Appears stable on admission  - Continue Xanax, Zoloft    DVT prophylaxis: sq heparin given on admission, post-operative ppx per surgeon preference Code Status: Full  Family Communication: Discussed with patient Disposition Plan: Admit to med-surg Consults called: Orthopedic surgery Admission status: Inpatient    Briscoe Deutscher, MD [facility] Pager [phone]  If 7PM-7AM, please contact night-coverage [email or web address] Password [facility]  05/19/2016, 6:54 PM

## 2016-05-19 NOTE — ED Notes (Signed)
Attempted IV x 1 in forearm but pt moved.  Attempted a second time in forearm with assistance holding pt but was unsuccessful.  IV placed in ac without difficulty.

## 2016-05-20 ENCOUNTER — Inpatient Hospital Stay (HOSPITAL_COMMUNITY): Payer: Medicare Other | Admitting: Anesthesiology

## 2016-05-20 ENCOUNTER — Encounter (HOSPITAL_COMMUNITY)
Admission: EM | Disposition: A | Discharge status: Skilled Nursing Facility | Payer: Self-pay | Source: Home / Self Care | Attending: Internal Medicine

## 2016-05-20 ENCOUNTER — Encounter (HOSPITAL_COMMUNITY): Payer: Self-pay

## 2016-05-20 ENCOUNTER — Inpatient Hospital Stay (HOSPITAL_COMMUNITY): Payer: Medicare Other

## 2016-05-20 DIAGNOSIS — E440 Moderate protein-calorie malnutrition: Secondary | ICD-10-CM | POA: Insufficient documentation

## 2016-05-20 HISTORY — PX: HIP ARTHROPLASTY: SHX981

## 2016-05-20 LAB — GLUCOSE, CAPILLARY
GLUCOSE-CAPILLARY: 171 mg/dL — AB (ref 65–99)
GLUCOSE-CAPILLARY: 120 mg/dL — AB (ref 65–99)
GLUCOSE-CAPILLARY: 125 mg/dL — AB (ref 65–99)
GLUCOSE-CAPILLARY: 125 mg/dL — AB (ref 65–99)
GLUCOSE-CAPILLARY: 136 mg/dL — AB (ref 65–99)
Glucose-Capillary: 126 mg/dL — ABNORMAL HIGH (ref 65–99)
Glucose-Capillary: 127 mg/dL — ABNORMAL HIGH (ref 65–99)

## 2016-05-20 LAB — BASIC METABOLIC PANEL
ANION GAP: 8 (ref 5–15)
BUN: 18 mg/dL (ref 6–20)
CALCIUM: 8.7 mg/dL — AB (ref 8.9–10.3)
CO2: 28 mmol/L (ref 22–32)
Chloride: 99 mmol/L — ABNORMAL LOW (ref 101–111)
Creatinine, Ser: 0.59 mg/dL (ref 0.44–1.00)
Glucose, Bld: 122 mg/dL — ABNORMAL HIGH (ref 65–99)
POTASSIUM: 3.7 mmol/L (ref 3.5–5.1)
Sodium: 135 mmol/L (ref 135–145)

## 2016-05-20 LAB — ABO/RH: ABO/RH(D): A POS

## 2016-05-20 LAB — PREPARE RBC (CROSSMATCH)

## 2016-05-20 IMAGING — CR DG PELVIS 1-2V
2 series · 2 of 2 positions shown · non-contrast
Comparison: 05/19/2016 pelvis and right hip radiographs.

CLINICAL DATA: 70 y/o  F; right hip replacement.

EXAM:
PELVIS - 1-2 VIEW

[ap (1 of 2)]
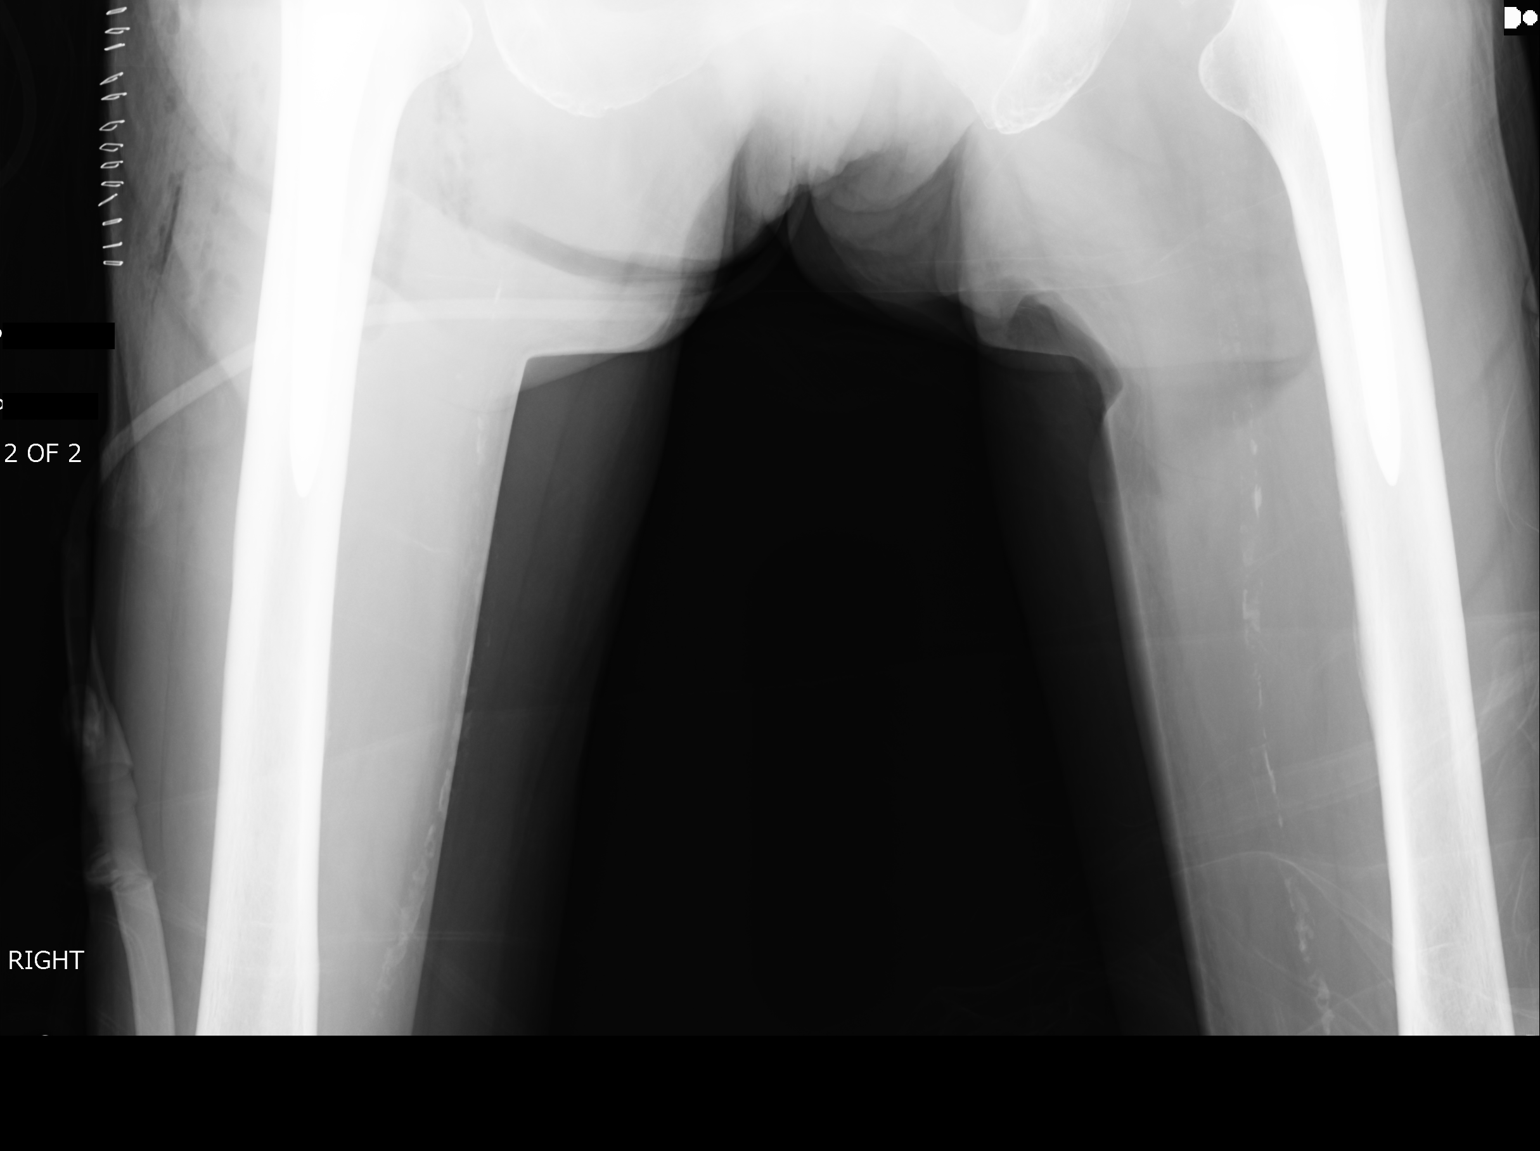

[ap (2 of 2)]
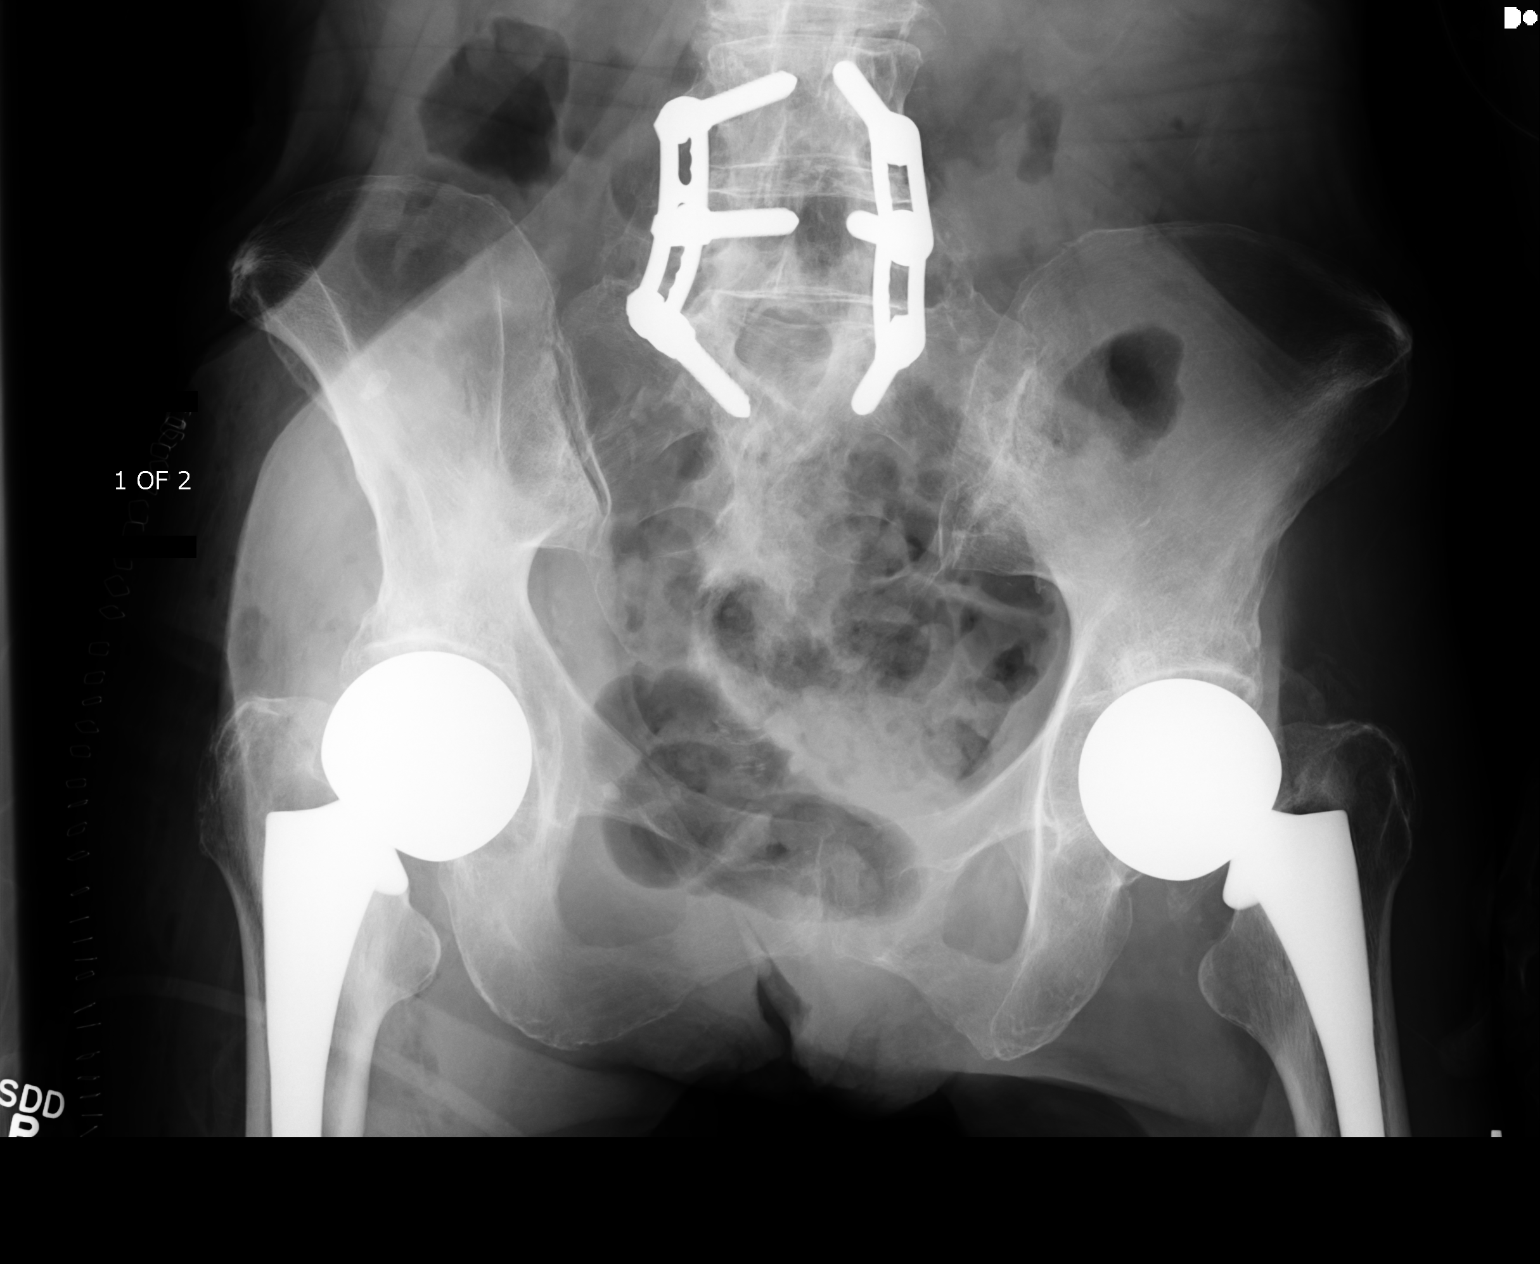

[2 of 2 positions shown; findings below may reference images not displayed]

FINDINGS: Air and soft tissue swelling of right upper thigh and lateral skin
staples compatible postsurgical changes. Right hip hemi arthroplasty
well-seated without apparent periprosthetic lucency or fracture. The
left hip hemi arthroplasty is unchanged. Lumbar spine fusion
hardware noted. Vascular calcifications. Foley catheter in situ.
IMPRESSION: Status post right hip hemi arthroplasty without apparent hardware
related complications and expected postsurgical soft tissue changes.

By: Jeron Hair M.D.

## 2016-05-20 SURGERY — HEMIARTHROPLASTY, HIP, DIRECT ANTERIOR APPROACH, FOR FRACTURE
Anesthesia: Spinal | Site: Hip | Laterality: Right

## 2016-05-20 MED ORDER — ACETAMINOPHEN 650 MG RE SUPP: 650.0000 mg | Freq: Four times a day (QID) | RECTAL | Status: DC | PRN

## 2016-05-20 MED ORDER — FENTANYL CITRATE (PF) 100 MCG/2ML IJ SOLN
INTRAMUSCULAR | Status: AC
  Filled 2016-05-20: qty 2

## 2016-05-20 MED ORDER — 0.9 % SODIUM CHLORIDE (POUR BTL) OPTIME
TOPICAL | Status: DC | PRN
  Administered 2016-05-20 (×2): 1000 mL

## 2016-05-20 MED ORDER — CEFAZOLIN SODIUM-DEXTROSE 2-4 GM/100ML-% IV SOLN
2.0000 g | INTRAVENOUS | Status: AC
  Administered 2016-05-20: 2 g via INTRAVENOUS

## 2016-05-20 MED ORDER — PROPOFOL 500 MG/50ML IV EMUL
INTRAVENOUS | Status: DC | PRN
  Administered 2016-05-20: 19:00:00 via INTRAVENOUS
  Administered 2016-05-20: 50 ug/kg/min via INTRAVENOUS

## 2016-05-20 MED ORDER — BUPIVACAINE-EPINEPHRINE (PF) 0.25% -1:200000 IJ SOLN
INTRAMUSCULAR | Status: AC
  Filled 2016-05-20: qty 60

## 2016-05-20 MED ORDER — ACETAMINOPHEN 325 MG PO TABS: 650.0000 mg | ORAL_TABLET | Freq: Four times a day (QID) | ORAL | Status: DC | PRN

## 2016-05-20 MED ORDER — BUPIVACAINE LIPOSOME 1.3 % IJ SUSP
INTRAMUSCULAR | Status: AC
  Filled 2016-05-20: qty 20

## 2016-05-20 MED ORDER — LACTATED RINGERS IV SOLN
INTRAVENOUS | Status: DC
  Administered 2016-05-20 (×2): via INTRAVENOUS

## 2016-05-20 MED ORDER — METOCLOPRAMIDE HCL 10 MG PO TABS: 5.0000 mg | ORAL_TABLET | Freq: Three times a day (TID) | ORAL | Status: DC | PRN

## 2016-05-20 MED ORDER — CEFAZOLIN SODIUM-DEXTROSE 2-4 GM/100ML-% IV SOLN
2.0000 g | Freq: Four times a day (QID) | INTRAVENOUS | Status: AC
  Administered 2016-05-21 (×2): 2 g via INTRAVENOUS
  Filled 2016-05-20 (×2): qty 100

## 2016-05-20 MED ORDER — MIDAZOLAM HCL 2 MG/2ML IJ SOLN
INTRAMUSCULAR | Status: AC
  Filled 2016-05-20: qty 2

## 2016-05-20 MED ORDER — FENTANYL CITRATE (PF) 100 MCG/2ML IJ SOLN: 25.0000 ug | INTRAMUSCULAR | Status: DC | PRN

## 2016-05-20 MED ORDER — MENTHOL 3 MG MT LOZG
1.0000 | LOZENGE | OROMUCOSAL | Status: DC | PRN
Start: 2016-05-20 — End: 2016-05-21

## 2016-05-20 MED ORDER — ONDANSETRON HCL 4 MG/2ML IJ SOLN: 4.0000 mg | Freq: Four times a day (QID) | INTRAMUSCULAR | Status: DC | PRN

## 2016-05-20 MED ORDER — PHENOL 1.4 % MT LIQD: 1.0000 | OROMUCOSAL | Status: DC | PRN

## 2016-05-20 MED ORDER — PHENYLEPHRINE HCL 10 MG/ML IJ SOLN
INTRAMUSCULAR | Status: DC | PRN
  Administered 2016-05-20: 40 ug via INTRAVENOUS

## 2016-05-20 MED ORDER — CHLORHEXIDINE GLUCONATE 4 % EX LIQD: 60.0000 mL | Freq: Once | CUTANEOUS | Status: DC

## 2016-05-20 MED ORDER — MIDAZOLAM HCL 2 MG/2ML IJ SOLN
1.0000 mg | INTRAMUSCULAR | Status: DC | PRN
  Administered 2016-05-20: 2 mg via INTRAVENOUS

## 2016-05-20 MED ORDER — EPHEDRINE SULFATE 50 MG/ML IJ SOLN
INTRAMUSCULAR | Status: DC | PRN
  Administered 2016-05-20: 5 mg via INTRAVENOUS
  Administered 2016-05-20: 10 mg via INTRAVENOUS
  Administered 2016-05-20: 5 mg via INTRAVENOUS

## 2016-05-20 MED ORDER — SODIUM CHLORIDE 0.9 % IJ SOLN
INTRAMUSCULAR | Status: AC
  Filled 2016-05-20: qty 40

## 2016-05-20 MED ORDER — BUPIVACAINE IN DEXTROSE 0.75-8.25 % IT SOLN
INTRATHECAL | Status: DC | PRN
  Administered 2016-05-20: 13 mg via INTRATHECAL

## 2016-05-20 MED ORDER — CEFAZOLIN SODIUM-DEXTROSE 2-4 GM/100ML-% IV SOLN
INTRAVENOUS | Status: AC
  Filled 2016-05-20: qty 100

## 2016-05-20 MED ORDER — ASPIRIN EC 325 MG PO TBEC
325.0000 mg | DELAYED_RELEASE_TABLET | Freq: Every day | ORAL | Status: DC
  Administered 2016-05-21: 325 mg via ORAL
  Filled 2016-05-20: qty 1

## 2016-05-20 MED ORDER — SODIUM CHLORIDE 0.9 % IV SOLN
Freq: Once | INTRAVENOUS | Status: AC
  Administered 2016-05-20: 10:00:00 via INTRAVENOUS

## 2016-05-20 MED ORDER — BUPIVACAINE-EPINEPHRINE (PF) 0.25% -1:200000 IJ SOLN
INTRAMUSCULAR | Status: DC | PRN
  Administered 2016-05-20: 60 mL

## 2016-05-20 MED ORDER — POVIDONE-IODINE 10 % EX SWAB
2.0000 "application " | Freq: Once | CUTANEOUS | Status: AC
  Administered 2016-05-20: 2 via TOPICAL

## 2016-05-20 MED ORDER — ONDANSETRON HCL 4 MG PO TABS: 4.0000 mg | ORAL_TABLET | Freq: Four times a day (QID) | ORAL | Status: DC | PRN

## 2016-05-20 MED ORDER — FENTANYL CITRATE (PF) 100 MCG/2ML IJ SOLN
INTRAMUSCULAR | Status: DC | PRN
  Administered 2016-05-20: 25 ug via INTRAVENOUS
  Administered 2016-05-20: 25 ug via INTRATHECAL
  Administered 2016-05-20 (×2): 25 ug via INTRAVENOUS

## 2016-05-20 MED ORDER — METOCLOPRAMIDE HCL 5 MG/ML IJ SOLN: 5.0000 mg | Freq: Three times a day (TID) | INTRAMUSCULAR | Status: DC | PRN

## 2016-05-20 MED ORDER — LACTATED RINGERS IV SOLN
INTRAVENOUS | Status: DC
  Administered 2016-05-20: 22:00:00 via INTRAVENOUS

## 2016-05-20 SURGICAL SUPPLY — 47 items
BAG HAMPER (MISCELLANEOUS) ×3 IMPLANT
BIT DRILL 2.8X128 (BIT) ×2 IMPLANT
BIT DRILL 2.8X128MM (BIT) ×1
BLADE 10 SAFETY STRL DISP (BLADE) ×3 IMPLANT
BLADE HEX COATED 2.75 (ELECTRODE) ×3 IMPLANT
BLADE SAGITTAL 25.0X1.27X90 (BLADE) ×2 IMPLANT
BLADE SAGITTAL 25.0X1.27X90MM (BLADE) ×1
CAPT HIP HEMI 1 ×3 IMPLANT
CHLORAPREP W/TINT 26ML (MISCELLANEOUS) ×6 IMPLANT
CLOTH BEACON ORANGE TIMEOUT ST (SAFETY) ×3 IMPLANT
COVER LIGHT HANDLE STERIS (MISCELLANEOUS) ×6 IMPLANT
DECANTER SPIKE VIAL GLASS SM (MISCELLANEOUS) ×6 IMPLANT
DRAPE HIP W/POCKET STRL (DRAPE) ×3 IMPLANT
DRSG MEPILEX BORDER 4X12 (GAUZE/BANDAGES/DRESSINGS) ×3 IMPLANT
ELECT REM PT RETURN 9FT ADLT (ELECTROSURGICAL) ×3
ELECTRODE REM PT RTRN 9FT ADLT (ELECTROSURGICAL) ×1 IMPLANT
GLOVE BIOGEL PI IND STRL 7.0 (GLOVE) ×3 IMPLANT
GLOVE BIOGEL PI INDICATOR 7.0 (GLOVE) ×6
GLOVE ECLIPSE 6.5 STRL STRAW (GLOVE) ×6 IMPLANT
GLOVE SKINSENSE NS SZ8.0 LF (GLOVE) ×4
GLOVE SKINSENSE STRL SZ8.0 LF (GLOVE) ×2 IMPLANT
GLOVE SS N UNI LF 8.5 STRL (GLOVE) ×3 IMPLANT
GOWN STRL REUS W/TWL LRG LVL3 (GOWN DISPOSABLE) ×6 IMPLANT
GOWN STRL REUS W/TWL XL LVL3 (GOWN DISPOSABLE) ×3 IMPLANT
INST SET MAJOR BONE (KITS) ×3 IMPLANT
KIT BLADEGUARD II DBL (SET/KITS/TRAYS/PACK) ×3 IMPLANT
KIT ROOM TURNOVER APOR (KITS) ×3 IMPLANT
MANIFOLD NEPTUNE II (INSTRUMENTS) ×3 IMPLANT
MARKER SKIN DUAL TIP RULER LAB (MISCELLANEOUS) ×3 IMPLANT
NEEDLE HYPO 21X1.5 SAFETY (NEEDLE) ×3 IMPLANT
NS IRRIG 1000ML POUR BTL (IV SOLUTION) ×6 IMPLANT
PACK TOTAL JOINT (CUSTOM PROCEDURE TRAY) ×3 IMPLANT
PAD ARMBOARD 7.5X6 YLW CONV (MISCELLANEOUS) ×3 IMPLANT
PASSER SUT SWANSON 36MM LOOP (INSTRUMENTS) ×3 IMPLANT
PILLOW HIP ABDUCTION SM (ORTHOPEDIC SUPPLIES) ×3 IMPLANT
PIN STMN SNGL STERILE 9X3.6MM (PIN) ×6 IMPLANT
SET BASIN LINEN APH (SET/KITS/TRAYS/PACK) ×3 IMPLANT
STAPLER VISISTAT 35W (STAPLE) ×3 IMPLANT
SUT BRALON NAB BRD #1 30IN (SUTURE) ×9 IMPLANT
SUT ETHIBOND 5 LR DA (SUTURE) ×6 IMPLANT
SUT MNCRL 0 VIOLET CTX 36 (SUTURE) ×1 IMPLANT
SUT MONOCRYL 0 CTX 36 (SUTURE) ×2
SUT VIC AB 1 CT1 27 (SUTURE) ×4
SUT VIC AB 1 CT1 27XBRD ANTBC (SUTURE) ×2 IMPLANT
SYR 30ML LL (SYRINGE) ×3 IMPLANT
SYR BULB IRRIGATION 50ML (SYRINGE) ×3 IMPLANT
WATER STERILE IRR 1000ML POUR (IV SOLUTION) ×6 IMPLANT

## 2016-05-20 NOTE — Anesthesia Preprocedure Evaluation (Signed)
Anesthesia Evaluation  Patient identified by MRN, date of birth, ID band Patient confused    Reviewed: Allergy & Precautions, NPO status , Patient's Chart, lab work & pertinent test results  Airway Mallampati: II  TM Distance: >3 FB     Dental  (+) Edentulous Upper, Edentulous Lower   Pulmonary neg pulmonary ROS,    breath sounds clear to auscultation       Cardiovascular hypertension, Pt. on medications  Rhythm:Regular Rate:Normal     Neuro/Psych PSYCHIATRIC DISORDERS ( Alzheimer's dementia)    GI/Hepatic negative GI ROS,   Endo/Other  diabetes, Type 2, Oral Hypoglycemic Agents  Renal/GU      Musculoskeletal   Abdominal   Peds  Hematology   Anesthesia Other Findings Malnutrition, muscle wasting  Reproductive/Obstetrics                             Anesthesia Physical Anesthesia Plan  ASA: III  Anesthesia Plan: Spinal   Post-op Pain Management:    Induction:   Airway Management Planned: Simple Face Mask  Additional Equipment:   Intra-op Plan:   Post-operative Plan:   Informed Consent: I have reviewed the patients History and Physical, chart, labs and discussed the procedure including the risks, benefits and alternatives for the proposed anesthesia with the patient or authorized representative who has indicated his/her understanding and acceptance.     Plan Discussed with:   Anesthesia Plan Comments: (CBG=136)        Anesthesia Quick Evaluation

## 2016-05-20 NOTE — Anesthesia Procedure Notes (Signed)
Spinal  Patient location during procedure: OR Start time: 05/20/2016 5:44 PM Staffing Anesthesiologist: Laurene FootmanGONZALEZ, LUIS Resident/CRNA: Kimball Appleby A Preanesthetic Checklist Completed: patient identified, site marked, surgical consent, pre-op evaluation, timeout performed, IV checked, risks and benefits discussed and monitors and equipment checked Spinal Block Patient position: right lateral decubitus Prep: Betadine Patient monitoring: heart rate, cardiac monitor, continuous pulse ox and blood pressure Approach: right paramedian Location: L3-4 Injection technique: single-shot Needle Needle type: Spinocan  Needle gauge: 22 G Needle length: 9 cm Assessment Sensory level: T8 Additional Notes ATTEMPTS:2 TRAY BJ:[phone] TRAY EXPIRATION DATE:01/19/2017

## 2016-05-20 NOTE — Clinical Social Work Placement (Signed)
   CLINICAL SOCIAL WORK PLACEMENT  NOTE  Date:  05/20/2016  Patient Details  Name: [name] MRN: [id number] Date of Birth: 13-Sep-1945  Clinical Social Work is seeking post-discharge placement for this patient at the Skilled  Nursing Facility level of care (*CSW will initial, date and re-position this form in  chart as items are completed):  Yes   Patient/family provided with [facility] Clinical Social Work Department's list of facilities offering this level of care within the geographic area requested by the patient (or if unable, by the patient's family).  Yes   Patient/family informed of their freedom to choose among providers that offer the needed level of care, that participate in Medicare, Medicaid or managed care program needed by the patient, have an available bed and are willing to accept the patient.  Yes   Patient/family informed of 's ownership interest in [facility] and [facility], as well as of the fact that they are under no obligation to receive care at these facilities.  PASRR submitted to EDS on       PASRR number received on       Existing PASRR number confirmed on 05/20/16     FL2 transmitted to all facilities in geographic area requested by pt/family on 05/20/16     FL2 transmitted to all facilities within larger geographic area on       Patient informed that his/her managed care company has contracts with or will negotiate with certain facilities, including the following:            Patient/family informed of bed offers received.  Patient chooses bed at       Physician recommends and patient chooses bed at      Patient to be transferred to   on  .  Patient to be transferred to facility by       Patient family notified on   of transfer.  Name of family member notified:        PHYSICIAN       Additional Comment:    _______________________________________________ Karn CassisStultz, Harveen Flesch [place], LCSW 05/20/2016, 12:36  PM [phone]

## 2016-05-20 NOTE — NC FL2 (Deleted)
[place] MEDICAID FL2 LEVEL OF CARE SCREENING TOOL     IDENTIFICATION  Patient Name: [name] Birthdate: 13-Oct-1945 Sex: female Admission Date (Current Location): 05/19/2016  [facility] [place]   Facility and Address:  [facility],  [address], [place] [id number]      Provider Number: [id number]  Attending Physician Name and Address:  Micael HampshireEstela Y Hernandez Acost*  Relative Name and Phone Number:       Current Level of Care: Hospital Recommended Level of Care: Skilled Nursing Facility Prior Approval Number:    Date Approved/Denied:   PASRR Number: [id number]  Discharge Plan: SNF    Current Diagnoses: Patient Active Problem List   Diagnosis Date Noted  . Dementia 05/19/2016  . Hypertension 05/19/2016  . Closed displaced fracture of right femoral neck (HCC) 05/19/2016  . Depression with anxiety 05/19/2016  . Non-insulin dependent type 2 diabetes mellitus (HCC) 05/19/2016  . Closed right hip fracture, initial encounter (HCC) 05/19/2016  . Fall   . Preop testing     Orientation RESPIRATION BLADDER Height & Weight     Self  Normal Incontinent Weight: 120 lb 13 oz (54.8 kg) Height:  5\' 3"  (160 cm)  BEHAVIORAL SYMPTOMS/MOOD NEUROLOGICAL BOWEL NUTRITION STATUS     (n/a) Incontinent Diet (NPO time specified. See d/c summary for updates.)  AMBULATORY STATUS COMMUNICATION OF NEEDS Skin   Total Care Verbally Surgical wounds, Bruising                       Personal Care Assistance Level of Assistance  Bathing, Feeding, Dressing Bathing Assistance: Maximum assistance Feeding assistance: Maximum assistance Dressing Assistance: Maximum assistance     Functional Limitations Info  Sight, Hearing, Speech Sight Info: Adequate Hearing Info: Adequate Speech Info: Adequate    SPECIAL CARE FACTORS FREQUENCY  PT (By licensed PT)                    Contractures      Additional Factors Info  Insulin Sliding Scale,  Psychotropic Code Status Info: Full code Allergies Info: No known allergies Psychotropic Info: Depakote sprinkle, Xanax, Ativan.         Current Medications (05/20/2016):  This is the current hospital active medication list Current Facility-Administered Medications  Medication Dose Route Frequency Provider Last Rate Last Dose  . 0.9 %  sodium chloride infusion   Intravenous Once Vickki HearingStanley E Harrison, MD      . ALPRAZolam Prudy Feeler(XANAX) tablet 0.25 mg  0.25 mg Oral Q8H PRN Briscoe Deutscherimothy S Opyd, MD      . cholecalciferol (VITAMIN D) tablet 3,000 Units  3,000 Units Oral Daily Lavone Neriimothy S Opyd, MD      . divalproex (DEPAKOTE SPRINKLE) capsule 125 mg  125 mg Oral BID Lavone Neriimothy S Opyd, MD      . docusate sodium (COLACE) capsule 100 mg  100 mg Oral BID Briscoe Deutscherimothy S Opyd, MD   100 mg at 05/19/16 2317  . donepezil (ARICEPT) tablet 10 mg  10 mg Oral QHS Lavone Neriimothy S Opyd, MD   10 mg at 05/19/16 2317  . hydrALAZINE (APRESOLINE) injection 5 mg  5 mg Intravenous Q4H PRN Briscoe Deutscherimothy S Opyd, MD      . HYDROcodone-acetaminophen (NORCO/VICODIN) 5-325 MG per tablet 1-2 tablet  1-2 tablet Oral Q6H PRN Lavone Neriimothy S Opyd, MD      . insulin aspart (novoLOG) injection 0-9 Units  0-9 Units Subcutaneous Q4H Briscoe Deutscherimothy S Opyd, MD   2  Units at 05/19/16 2047  . LORazepam (ATIVAN) injection 0.5 mg  0.5 mg Intravenous Q4H PRN Briscoe Deutscherimothy S Opyd, MD      . memantine [facility](NAMENDA) tablet 10 mg  10 mg Oral BID Briscoe Deutscherimothy S Opyd, MD   10 mg at 05/19/16 2317  . methocarbamol (ROBAXIN) tablet 500 mg  500 mg Oral Q6H PRN Briscoe Deutscherimothy S Opyd, MD       Or  . methocarbamol (ROBAXIN) 500 mg in dextrose 5 % 50 mL IVPB  500 mg Intravenous Q6H PRN Timothy S Opyd, MD      . morphine 2 MG/ML injection 0.5 mg  0.5 mg Intravenous Q2H PRN Lavone Neriimothy S Opyd, MD      . sertraline (ZOLOFT) tablet 100 mg  100 mg Oral Daily Briscoe Deutscherimothy S Opyd, MD         Discharge Medications: Please see discharge summary for a list of discharge medications.  Relevant Imaging Results:  Relevant Lab  Results:   Additional Information SSN: [id number]  Karn CassisStultz, Monicka Cyran [place], [place] [phone]

## 2016-05-20 NOTE — Consult Note (Addendum)
Consult Req: Dr Kathrene BongoA Hernandez  History from ER and HP by Dr Marcial Pacasimothy Opyd due to dementia  Misty KaplanBarbara Visconti NWG:[id number] DOB: 04-05-1946 DOA: 05/19/2016   PCP: No PCP Per Patient    Patient coming from: SNF   Chief Complaint: Right hip pain with deformity after unwitnessed fall    HPI: [name] is a 70 y.o. female with medical history significant for advanced Alzheimer's dementia, hypertension, depression with anxiety, and type 2 diabetes mellitus who presents from her SNF for evaluation of right hip pain with gross deformity following an unwitnessed fall. History is obtained through discussion with the ED and SNF personnel and review of the EMR as patient is limited by advanced dementia. She had reportedly [facility] the night prior to this admission after a fall. She reportedly had right hip pain at that time but imaging was reported as negative. She apparently suffered a second fall today and was noted to have shortening and external rotation of the right leg. She complained of right hip pain and was unable to bear weight. She had seemed to be in her usual state prior to these falls with no recent fevers or chills and no recent cough, vomiting, or diarrhea. Other than right hip pain, the patient has not voiced any complaints. There has been no apparent head injury and no loss of consciousness has been reported. The patient is not anticoagulated. Surgical history, if any, is unknown.   ED Course: Upon arrival to the ED, patient is found to be afebrile, saturating well on room air, and with vitals otherwise stable. EKG demonstrates a sinus rhythm with low voltage QRS and chest x-ray is negative for acute cardiopulmonary disease. Radiographs of the hip demonstrate a markedly displaced acute right femoral neck fracture. Chemistry panel is notable for an elevated BUN to creatinine ratio and CBC is unremarkable. INR is within the normal limits and urinalysis remains pending.  Type and screen was performed in the emergency department and orthopedic surgery was consulted by the ED physician. The orthopedist recommends a medical admission to independent hospital and will plan to see the patient in the morning. Patient was treated with fentanyl in the ED, remained hemodynamically stable and in no respiratory distress, and will be admitted to the medical-surgical unit for ongoing evaluation and management of acute displaced fracture of the right hip.    Review of Systems:  Unable to obtain ROS secondary to patient's clinical condition with advanced dementia.  Past Medical History:  Diagnosis Date  . Alzheimer's dementia   . Feeding problems   . Hypertension   . Muscle weakness   . Renal disorder    History reviewed. No pertinent surgical history. Family History  Problem Relation Age of Onset  . Family history unknown: Yes   Social History  Substance Use Topics  . Smoking status: Unknown If Ever Smoked  . Smokeless tobacco: Never Used  . Alcohol use No   Current Meds  Medication Sig  . cholecalciferol (VITAMIN D) 1000 units tablet Take 3,000 Units by mouth daily.  . divalproex (DEPAKOTE SPRINKLE) 125 MG capsule Take 125 mg by mouth 2 (two) times daily.  Marland Kitchen. donepezil (ARICEPT) 10 MG tablet Take 10 mg by mouth at bedtime.  . Lactobacillus Rhamnosus, GG, (CULTURELLE) CAPS Take 1 capsule by mouth daily.  Marland Kitchen. lisinopril (PRINIVIL,ZESTRIL) 5 MG tablet Take 5 mg by mouth daily.  . memantine (NAMENDA) 10 MG tablet Take 10 mg by mouth 2 (two) times daily.  .Marland Kitchen  metFORMIN (GLUCOPHAGE) 500 MG tablet Take 500 mg by mouth 2 (two) times daily.  . mupirocin ointment (BACTROBAN) 2 % Apply 1 application topically every 12 (twelve) hours.  . sertraline (ZOLOFT) 100 MG tablet Take 100 mg by mouth daily.    Physical Exam  BP (!) 148/69 (BP Location: Right Arm)   Pulse 82   Temp 98.8 F (37.1 C) (Oral)   Resp 18   Ht 5\' 3"  (1.6 m)   Wt 120 lb 13 oz (54.8 kg)   SpO2 97%    BMI 21.40 kg/m    Physical Exam  Constitutional: She appears well-developed and well-nourished.  Non-toxic appearance. No distress.  Eyes: Conjunctivae, EOM and lids are normal. Right eye exhibits no discharge and no exudate. Left eye exhibits no discharge and no exudate. Right conjunctiva is not injected. Left conjunctiva is not injected.  Neck: Trachea normal, normal range of motion and full passive range of motion without pain. Neck supple. No neck rigidity. Normal range of motion present. No thyroid mass and no thyromegaly present.  Cardiovascular: Intact distal pulses.   Pulses:      Radial pulses are 2+ on the right side, and 2+ on the left side.       Dorsalis pedis pulses are 2+ on the right side, and 2+ on the left side.  Musculoskeletal:  Not walking due to fracture   Right Left upper extremity: normal rom joint stability alignment muscle tone and skin  Left lower extremity normal alignment, no contracture subluxation atrophy tremor skin lesion.  Right lower extremity is externally rotated tender at the proximal hip range of motion stability tests were deferred because of pain muscle tone was normal skin was normal pulses were normal  Lymphadenopathy:       Right cervical: No superficial cervical adenopathy present.      Left cervical: No superficial cervical adenopathy present.    She has no axillary adenopathy.       Right: No supraclavicular adenopathy present.       Left: No supraclavicular adenopathy present.  Neurological: She is alert. She displays no atrophy and no tremor. No sensory deficit. She exhibits normal muscle tone. Gait abnormal. She displays no Babinski's sign on the right side. She displays no Babinski's sign on the left side.  Reflex Scores:      Bicep reflexes are 2+ on the right side and 2+ on the left side. Skin: Skin is warm, dry and intact. No abrasion, no laceration and no rash noted. She is not diaphoretic. No erythema.  Psychiatric: She has a normal  mood and affect. Her speech is normal and behavior is normal.  Dementia can not assess judgment or thought content She is attentive.  Vitals reviewed.   CBC Latest Ref Rng & Units 05/19/2016  WBC 4.0 - 10.5 K/uL 7.7  Hemoglobin 12.0 - 15.0 g/dL 96.012.4  Hematocrit 45.436.0 - 46.0 % 37.6  Platelets 150 - 400 K/uL 152   BMP Latest Ref Rng & Units 05/20/2016 05/19/2016 05/19/2016  Glucose 65 - 99 mg/dL 098(J122(H) - 191(Y158(H)  BUN 6 - 20 mg/dL 18 - 78(G21(H)  Creatinine 0.44 - 1.00 mg/dL 9.560.59 - 2.130.63  Sodium 086135 - 145 mmol/L 135 - 135  Potassium 3.5 - 5.1 mmol/L 3.7 - 3.6  Chloride 101 - 111 mmol/L 99(L) - 98(L)  CO2 22 - 32 mmol/L 28 - 29  Calcium 8.9 - 10.3 mg/dL 5.7(Q8.7(L) 8.9 8.9    X-ray shows a displaced right femoral neck  fracture and a prior left bipolar replacement  Impression closed right femoral neck fracture displaced  Plan right bipolar prosthesis  This procedure has been fully reviewed with the power of attorney and written informed consent will be obtained upon her arrival

## 2016-05-20 NOTE — NC FL2 (Signed)
[place] MEDICAID FL2 LEVEL OF CARE SCREENING TOOL     IDENTIFICATION  Patient Name: Misty KaplanBarbara Mckee Birthdate: 04/02/1946 Sex: female Admission Date (Current Location): 05/19/2016  [facility] [place]:  [place]   Facility and Address:  [facility],  [address], [place] [id number]      Provider Number: [id number]  Attending Physician Name and Address:  Micael HampshireEstela Y Hernandez Acost*  Relative Name and Phone Number:       Current Level of Care: Hospital Recommended Level of Care: Skilled Nursing Facility Prior Approval Number:    Date Approved/Denied:   PASRR Number: [id number]  Discharge Plan: SNF    Current Diagnoses: Patient Active Problem List   Diagnosis Date Noted  . Malnutrition of moderate degree 05/20/2016  . Dementia 05/19/2016  . Hypertension 05/19/2016  . Closed displaced fracture of right femoral neck (HCC) 05/19/2016  . Depression with anxiety 05/19/2016  . Non-insulin dependent type 2 diabetes mellitus (HCC) 05/19/2016  . Closed right hip fracture, initial encounter (HCC) 05/19/2016  . Fall   . Preop testing     Orientation RESPIRATION BLADDER Height & Weight     Self  Normal Incontinent Weight: 120 lb 13 oz (54.8 kg) Height:  5\' 3"  (160 cm)  BEHAVIORAL SYMPTOMS/MOOD NEUROLOGICAL BOWEL NUTRITION STATUS  Wanderer  (n/a) Incontinent Diet (NPO time specified. See d/c summary for updates.)  AMBULATORY STATUS COMMUNICATION OF NEEDS Skin   Extensive Assist Verbally Surgical wounds, Bruising                       Personal Care Assistance Level of Assistance  Bathing, Feeding, Dressing Bathing Assistance: Maximum assistance Feeding assistance:  (requires 1 on 1 assist) Dressing Assistance: Maximum assistance     Functional Limitations Info  Sight, Hearing, Speech Sight Info: Adequate Hearing Info: Adequate Speech Info: Adequate    SPECIAL CARE FACTORS FREQUENCY  PT (By licensed PT)                     Contractures      Additional Factors Info  Insulin Sliding Scale, Psychotropic Code Status Info: Full code Allergies Info: No known allergies Psychotropic Info: Depakote sprinkle, Xanax, Ativan.         Current Medications (05/20/2016):  This is the current hospital active medication list Current Facility-Administered Medications  Medication Dose Route Frequency Provider Last Rate Last Dose  . ALPRAZolam (XANAX) tablet 0.25 mg  0.25 mg Oral Q8H PRN Briscoe Deutscherimothy S Opyd, MD      . cholecalciferol (VITAMIN D) tablet 3,000 Units  3,000 Units Oral Daily Estela Isaiah BlakesY Hernandez Acosta, MD   3,000 Units at 05/20/16 [phone]  . divalproex (DEPAKOTE SPRINKLE) capsule 125 mg  125 mg Oral BID Briscoe Deutscherimothy S Opyd, MD   125 mg at 05/20/16 0958  . docusate sodium (COLACE) capsule 100 mg  100 mg Oral BID Briscoe Deutscherimothy S Opyd, MD   100 mg at 05/20/16 0958  . donepezil (ARICEPT) tablet 10 mg  10 mg Oral QHS Lavone Neriimothy S Opyd, MD   10 mg at 05/19/16 2317  . hydrALAZINE (APRESOLINE) injection 5 mg  5 mg Intravenous Q4H PRN Briscoe Deutscherimothy S Opyd, MD      . HYDROcodone-acetaminophen (NORCO/VICODIN) 5-325 MG per tablet 1-2 tablet  1-2 tablet Oral Q6H PRN Lavone Neriimothy S Opyd, MD      . insulin aspart (novoLOG) injection 0-9 Units  0-9 Units Subcutaneous Q4H Briscoe Deutscherimothy S Opyd, MD   2 Units at 05/19/16  2047  . LORazepam (ATIVAN) injection 0.5 mg  0.5 mg Intravenous Q4H PRN Briscoe Deutscherimothy S Opyd, MD      . memantine [facility](NAMENDA) tablet 10 mg  10 mg Oral BID Briscoe Deutscherimothy S Opyd, MD   10 mg at 05/20/16 0958  . methocarbamol (ROBAXIN) tablet 500 mg  500 mg Oral Q6H PRN Briscoe Deutscherimothy S Opyd, MD       Or  . methocarbamol (ROBAXIN) 500 mg in dextrose 5 % 50 mL IVPB  500 mg Intravenous Q6H PRN Lavone Neriimothy S Opyd, MD      . morphine 2 MG/ML injection 0.5 mg  0.5 mg Intravenous Q2H PRN Briscoe Deutscherimothy S Opyd, MD      . sertraline (ZOLOFT) tablet 100 mg  100 mg Oral Daily Briscoe Deutscherimothy S Opyd, MD   100 mg at 05/20/16 [phone]     Discharge Medications: Please see discharge summary for a list of  discharge medications.  Relevant Imaging Results:  Relevant Lab Results:   Additional Information SSN: [id number]. Requires memory care unit.   Derenda FennelStultz, Fate Caster [place], [place] [phone]

## 2016-05-20 NOTE — Anesthesia Procedure Notes (Signed)
Procedure Name: MAC Date/Time: 05/20/2016 5:20 PM Performed by: Pernell DupreADAMS, AMY A Pre-anesthesia Checklist: Timeout performed, Patient identified, Emergency Drugs available, Suction available and Patient being monitored Oxygen Delivery Method: Simple face mask

## 2016-05-20 NOTE — Anesthesia Postprocedure Evaluation (Signed)
Anesthesia Post Note Late Entry for 2020  Patient: Derwood KaplanBarbara Gales  Procedure(s) Performed: Procedure(s) (LRB): BIPOLAR HIP REPLACEMENT RIGHT (Right)  Patient location during evaluation: PACU Anesthesia Type: Spinal Level of consciousness: awake and confused (Patient at baseline orientation; oriented to self only preop) Pain management: pain level controlled Vital Signs Assessment: post-procedure vital signs reviewed and stable Respiratory status: spontaneous breathing Cardiovascular status: stable Postop Assessment: no headache Anesthetic complications: no    Last Vitals:  Vitals:   05/20/16 2000 05/20/16 2015  BP: (!) 146/63   Pulse:    Resp: 10 19  Temp:      Last Pain:  Vitals:   05/20/16 2015  TempSrc:   PainSc: Asleep                 ADAMS, AMY A

## 2016-05-20 NOTE — Brief Op Note (Signed)
05/19/2016 - 05/20/2016  7:35 PM  PATIENT:  Misty KaplanBarbara Mckee  70 y.o. female  PRE-OPERATIVE DIAGNOSIS:  right hip fracture displaced femoral neck  POST-OPERATIVE DIAGNOSIS:  right hip fracture displaced femoral neck  depuy hip implant summit basic PF 4 WITH UNIPOLAR (-3 HEAD)  Assisted by [name]  Spinal anesthetic  PROCEDURE:  Procedure(s): UNIPOLAR HIP REPLACEMENT RIGHT (Right)  SURGEON:  Surgeon(s) and Role:    * Vickki HearingStanley E Yancey Pedley, MD - Primary   EBL:  Total I/O In: -  Out: 50 [Blood:50]  BLOOD ADMINISTERED:none  DRAINS: none   LOCAL MEDICATIONS USED:  MARCAINE     SPECIMEN:  No Specimen  DISPOSITION OF SPECIMEN:  N/A  COUNTS:  YES  TOURNIQUET:  * No tourniquets in log *  DICTATION: .Dragon Dictation  PLAN OF CARE: Admit to inpatient   PATIENT DISPOSITION:  PACU - hemodynamically stable.   Delay start of Pharmacological VTE agent (>24hrs) due to surgical blood loss or risk of bleeding: yes  Details of surgery  The patient was identified in the preop area. Right hip was confirmed as surgical site. Marked as such. Taken to surgery. Spinal anesthetic was administered. Patient was placed on the operating table lateral decubitus position with right side up. Appropriate axillary roll and padding was placed  Hip was prepped and draped with sterile technique using ChloraPrep  Timeout was taken  Straight incision was made over the greater trochanter extended proximally and distally equidistant. Subcutaneous taste tissue divided. The trocar used to coagulate subcutaneous bleeders. The fascia was split in line with the skin incision. A bursectomy was performed over the greater trochanter.  The vastus lateralis and anterior abductors were subperiosteally dissected from the femur and trochanter splitting the muscle 3 cm above the trochanter and reflecting the capsule with it all as one continuous flap. The hip was dislocated the head was removed. The head was  measured a size 50.   The proximal femur was then prepared with femoral neck cutting guide followed by box osteotome and canal finder starter and trochanteric reamer.  Serial broaching starting with a #1 up to a size 5. Trial reduction was performed with a 1.5 neck and a 50 head. The hip was not reducible secondary to capsular contracture iliotibial band contracture and psoas contracture  These tissues were released in serial manner and the hip was still not reducible  I recut the femoral neck and re-trialed with the same implants and still could not get the hip reduced. After re-cutting the neck and trial reduction for third time I still could not get the hip reduced  I then used a 50 with a -3 unipolar head and got the hip reduced. I was able to flex the knee at that point past 90 had good shuck test good flexion internal rotation good external rotation and extension and excellent sleep position testing  The trial components were removed the hip was irrigated the acetabulum was checked. Looked relatively clean with minimal arthritic change  We placed the real implants gave the femoral head 1 firm blow to seated and then reduced the hip. The abductor vastus lateralis flap was closed with #5 Ethibond. Hip was abducted and the fascia was closed with #1 Bralon. Septated tissue closed with 0 Monocryl. At each layer we injected 30 mL of Marcaine for total of 60 mL. Skin was closed with staples. The bandage was placed. The patient was placed on a regular bed she has a 5 mm short right leg.  This should not be of any consequence.  Abduction pillow was placed  Patient taken recovery room stable condition  Weightbearing as tolerated direct lateral hip precautions will be instituted  Staples will come out at postop day 12-14  DVT prevention for 28 days.  [id number]

## 2016-05-20 NOTE — Care Management Important Message (Signed)
Important Message  Patient Details  Name: Misty KaplanBarbara Mckee MRN: [id number] Date of Birth: [birth year]   Medicare Important Message Given:  Yes    Annamae Shivley, Chrystine OilerSharley Diane, RN 05/20/2016, 1:59 PM

## 2016-05-20 NOTE — Clinical Social Work Note (Signed)
Clinical Social Work Assessment  Patient Details  Name: [name] MRN: [id number] Date of Birth: 26-Oct-1945  Date of referral:  05/20/16               Reason for consult:  Discharge Planning                Permission sought to share information with:    Permission granted to share information::     Name::        Agency::     Relationship::     Contact Information:     Housing/Transportation Living arrangements for the past 2 months:  [place] of Information:  Adult Children Patient Interpreter Needed:  None Criminal Activity/Legal Involvement Pertinent to Current Situation/Hospitalization:  No - Comment as needed Significant Relationships:  Adult Children Lives with:  Facility Resident Do you feel safe going back to the place where you live?  No (Needs rehab) Need for family participation in patient care:  Yes (Comment)  Care giving concerns:  Pt is at ALF and will require higher level of care.    Social Worker assessment / plan:  CSW met with pt's daughter, [name] at bedside who indicates she is also HCPOA. Pt has Alzheimer's and is oriented to self only. [name] states she was diagnosed with dementia in 2011 and they had to place pt at [facility] not too long after as pt was not safe at home and was wandering. Pt admitted due to right hip fracture. [name] reports pt broke her left hip last year and went to [facility] for several weeks prior to returning to [facility]. At baseline, pt requires assist with dressing, bathing, toileting, and feeding. She ambulates independently. Discussed need for SNF at d/c. [name] states she will discuss with ALF. She requests for pt to go to [facility] and said pt will require memory care unit as she states as soon as pt can get up, she will wander and leave facility. This was noted on FL2.   Employment status:  Retired Nurse, adult PT Recommendations:  Not  assessed at this time Information / Referral to community resources:  [place]  Patient/Family's Response to care:  [name] aware pt will likely need SNF after surgery.   Patient/Family's Understanding of and Emotional Response to Diagnosis, Current Treatment, and Prognosis:  [name] aware of admission diagnosis and plan for surgery later today. She is frustrated as facility did not notify her that pt was coming to hospital.   Emotional Assessment Appearance:  Appears older than stated age Attitude/Demeanor/Rapport:  Unable to Assess Affect (typically observed):  Unable to Assess Orientation:  Oriented to Self Alcohol / Substance use:  Not Applicable Psych involvement (Current and /or in the community):  No (Comment)  Discharge Needs  Concerns to be addressed:  Discharge Planning Concerns Readmission within the last 30 days:  No Current discharge risk:  Cognitively Impaired Barriers to Discharge:  Continued Medical Work up   [employer], [place] 05/20/2016, 12:44 PM [phone]

## 2016-05-20 NOTE — Progress Notes (Signed)
Initial Nutrition Assessment  DOCUMENTATION CODES:  Non-severe (moderate) malnutrition in context of chronic illness   Pt meets criteria for MODERATE MALNUTRITION in the context of Chronic Illness as evidenced by Moderate degree of muscle wasting and fat loss.  INTERVENTION:  Monitor for diet advancement and order protein supplements when able.   Per daughter, pt requires meal assisstance  NUTRITION DIAGNOSIS:  Increased nutrient needs related to Acute injury and likely impending surgery as evidenced by estimated nutritional requirements for this condition  GOAL:  Patient will meet greater than or equal to 90% of their needs  MONITOR:  Diet advancement, Labs, I & O's  REASON FOR ASSESSMENT:  Consult Hip fracture protocol  ASSESSMENT:  70 y/o female PMhx HTN, depression with anxiety, DM2 and advanced alzheimers dementia. [facility] for [facility] fracture.    Pt with alzheimer's dementia and is unable to converse. Little information available in chart. All history obtained from daughter at bedside.   She reports that pt lives at [facility] and has since 2012. There she does require assistance with feeding, though she is able to eat finger foods by herself. She typically does not know how to use spoon/fork though occasionally she "will remember if you show her". Daughter reports patient was not on any type of altered consistency diet. No reported dysphagia. She received 3 "Ensure-like" supplements per day, 1 at each meal.   Daughter does not know the pt's usual bw. She says that per [facility], pt has lost 30 lbs since she was admitted there in 2012. Daughter believes this is mostly related to the initial decrease in appetite associated with the rapid progression of pts dementia. RD asked if she felt the patients appetite was still declining or had reached a plateau and she responds "She eats if you place food in front of her".  Apparently, [facility]  reported a gain of 5 lbs this past month. Subjectively, the patient does appears thinner to the daughter.   Daughter reports that patient has had chronic diarrhea since her first hip surgery roughly 1 year ago. She says the pt was being worked up outpatient for her ongoing diarrhea and weight loss.  Daughter says she plans to be in hospital with patient as often as she can because the pt is very prone to getting out of bed, placing items in her mouth (was chewing on bandaid) and can be combative when attempted to be redirected.   Pt has surgery planned for this afternoon. [facility] and then return to [facility] once diet is advanced.   NFPE reveals upper body fat/muscle wasting, both of moderate degree.   Labs reviewed: BG:105-125 Medications: Vitamin D, Colace, Insulin, memantine, aricept.    Recent Labs Lab 05/19/16 1716 05/20/16 0441  NA 135 135  K 3.6 3.7  CL 98* 99*  CO2 29 28  BUN 21* 18  CREATININE 0.63 0.59  CALCIUM 8.9  8.9 8.7*  GLUCOSE 158* 122*   Diet Order:  Diet NPO time specified Diet NPO time specified Except for: Sips with Meds  Skin:  Reviewed, no issues  Last BM:  Unknown  Height:  Ht Readings from Last 1 Encounters:  05/19/16 5\' 3"  (1.6 m)   Weight:  Wt Readings from Last 1 Encounters:  05/19/16 120 lb 13 oz (54.8 kg)   Ideal Body Weight:  52.27 kg  BMI:  Body mass index is 21.4 kg/m.  Estimated Nutritional  Needs:  Kcal:  1650-1850 kcals (30-34 kcal/kg bw) Protein:  66-77g (1.2-1.4 g/kg bw) Fluid:  >1.7 Liters (30 ml/kg bw)  EDUCATION NEEDS:  No education needs identified at this time  Christophe LouisNathan Franks RD, LDN, CNSC Clinical Nutrition Pager: [phone] 05/20/2016 11:51 AM

## 2016-05-20 NOTE — Transfer of Care (Signed)
Immediate Anesthesia Transfer of Care Note  Patient: Misty KaplanBarbara Mckee  Procedure(s) Performed: Procedure(s): BIPOLAR HIP REPLACEMENT RIGHT (Right)  Patient Location: PACU  Anesthesia Type:Spinal  Level of Consciousness: awake and patient cooperative  Airway & Oxygen Therapy: Patient Spontanous Breathing and Patient connected to nasal cannula oxygen  Post-op Assessment: Report given to RN and Post -op Vital signs reviewed and stable  Post vital signs: Reviewed and stable  Last Vitals:  Vitals:   05/20/16 1600 05/20/16 1605  BP: (!) 153/82 137/78  Pulse:    Resp: 20 18  Temp:      Last Pain:  Vitals:   05/20/16 1556  TempSrc:   PainSc: 0-No pain         Complications: No apparent anesthesia complications

## 2016-05-20 NOTE — Op Note (Signed)
05/20/2016  7:35 PM  PATIENT:  Misty KaplanBarbara Mckee  70 y.o. female  PRE-OPERATIVE DIAGNOSIS:  right hip fracture displaced femoral neck  POST-OPERATIVE DIAGNOSIS:  right hip fracture displaced femoral neck  depuy hip implant summit basic PF 4 WITH UNIPOLAR (-3 HEAD)  Assisted by [place] NationBetty Mckee  Spinal anesthetic  PROCEDURE:  Procedure(s): UNIPOLAR HIP REPLACEMENT RIGHT (Right)  SURGEON:  Surgeon(s) and Role:    * Vickki HearingStanley E Ameli Sangiovanni, MD - Primary   EBL:  Total I/O In: -  Out: 50 [Blood:50]  BLOOD ADMINISTERED:none  DRAINS: none   LOCAL MEDICATIONS USED:  MARCAINE     SPECIMEN:  No Specimen  DISPOSITION OF SPECIMEN:  N/A  COUNTS:  YES  TOURNIQUET:  * No tourniquets in log *  DICTATION: .Dragon Dictation  PLAN OF CARE: Admit to inpatient   PATIENT DISPOSITION:  PACU - hemodynamically stable.   Delay start of Pharmacological VTE agent (>24hrs) due to surgical blood loss or risk of bleeding: yes  Details of surgery  The patient was identified in the preop area. Right hip was confirmed as surgical site. Marked as such. Taken to surgery. Spinal anesthetic was administered. Patient was placed on the operating table lateral decubitus position with right side up. Appropriate axillary roll and padding was placed  Hip was prepped and draped with sterile technique using ChloraPrep  Timeout was taken  Straight incision was made over the greater trochanter extended proximally and distally equidistant. Subcutaneous taste tissue divided. The trocar used to coagulate subcutaneous bleeders. The fascia was split in line with the skin incision. A bursectomy was performed over the greater trochanter.  The vastus lateralis and anterior abductors were subperiosteally dissected from the femur and trochanter splitting the muscle 3 cm above the trochanter and reflecting the capsule with it all as one continuous flap. The hip was dislocated the head was removed. The head was measured a size  50.   The proximal femur was then prepared with femoral neck cutting guide followed by box osteotome and canal finder starter and trochanteric reamer.  Serial broaching starting with a #1 up to a size 5. Trial reduction was performed with a 1.5 neck and a 50 head. The hip was not reducible secondary to capsular contracture iliotibial band contracture and psoas contracture  These tissues were released in serial manner and the hip was still not reducible  I recut the femoral neck and re-trialed with the same implants and still could not get the hip reduced. After re-cutting the neck and trial reduction for third time I still could not get the hip reduced  I then used a 50 with a -3 unipolar head and got the hip reduced. I was able to flex the knee at that point past 90 had good shuck test good flexion internal rotation good external rotation and extension and excellent sleep position testing  The trial components were removed the hip was irrigated the acetabulum was checked. Looked relatively clean with minimal arthritic change  We placed the real implants gave the femoral head 1 firm blow to seated and then reduced the hip. The abductor vastus lateralis flap was closed with #5 Ethibond. Hip was abducted and the fascia was closed with #1 Bralon. Septated tissue closed with 0 Monocryl. At each layer we injected 30 mL of Marcaine for total of 60 mL. Skin was closed with staples. The bandage was placed. The patient was placed on a regular bed she has a 5 mm short right leg.  This should  not be of any consequence.  Abduction pillow was placed  Patient taken recovery room stable condition  Weightbearing as tolerated direct lateral hip precautions will be instituted  Staples will come out at postop day 12-14  DVT prevention for 28 days.  [id number]

## 2016-05-20 NOTE — Progress Notes (Signed)
PROGRESS NOTE    Derwood KaplanBarbara Romm  WUJ:[id number] DOB: 12-14-1945 DOA: 05/19/2016 PCP: No PCP Per Patient     Brief Narrative:  70 y/o woman admitted 11/28 after a mechanical fall resulting in a right hip fracture. Plan for OR 11/29 by Dr. Romeo AppleHarrison.   Assessment & Plan:   Principal Problem:   Closed displaced fracture of right femoral neck (HCC) Active Problems:   Dementia   Hypertension   Depression with anxiety   Non-insulin dependent type 2 diabetes mellitus (HCC)   Closed right hip fracture, initial encounter (HCC)   Malnutrition of moderate degree   Right Hip Fracture -For OR 11/30. -Will need SNF afterwards; SW aware.  Dementia -At baseline.  HTN -Fair control. -Continue home meds  DM II -Well controlled.   DVT prophylaxis: SQ heaprin Code Status: full code Family Communication: daughter at bedside updated on plan of care and all questions answered Disposition Plan: OR today, hope for SNF in am  Consultants:   Orthopedics  Procedures:   None  Antimicrobials:  Anti-infectives    None       Subjective: No complaints  Objective: Vitals:   05/19/16 2300 05/19/16 2354 05/20/16 0400 05/20/16 0804  BP: 100/70 (!) 154/62 (!) 142/69 (!) 148/69  Pulse: 81 78 82 82  Resp: 18 18 18 18   Temp: 98.7 F (37.1 C) 98.5 F (36.9 C) 98.1 F (36.7 C) 98.8 F (37.1 C)  TempSrc: Oral Oral Oral Oral  SpO2: 98% 97% 99% 97%  Weight:      Height:        Intake/Output Summary (Last 24 hours) at 05/20/16 1517 Last data filed at 05/20/16 0400  Gross per 24 hour  Intake           582.67 ml  Output                0 ml  Net           582.67 ml   Filed Weights   05/19/16 1551 05/19/16 2011  Weight: 56.7 kg (125 lb) 54.8 kg (120 lb 13 oz)    Examination:  General exam: Awake, not oriented Respiratory system: Clear to auscultation. Respiratory effort normal. Cardiovascular system:RRR. No murmurs, rubs, gallops. Gastrointestinal system: Abdomen is  nondistended, soft and nontender. No organomegaly or masses felt. Normal bowel sounds heard. Central nervous system: . No focal neurological deficits. Extremities: right external rotation Skin: No rashes, lesions or ulcers Psychiatry: Unable to assess given dementia; mood appears stable and calm at present    Data Reviewed: I have personally reviewed following labs and imaging studies  CBC:  Recent Labs Lab 05/19/16 1716  WBC 7.7  NEUTROABS 6.7  HGB 12.4  HCT 37.6  MCV 89.5  PLT 152   Basic Metabolic Panel:  Recent Labs Lab 05/19/16 1716 05/20/16 0441  NA 135 135  K 3.6 3.7  CL 98* 99*  CO2 29 28  GLUCOSE 158* 122*  BUN 21* 18  CREATININE 0.63 0.59  CALCIUM 8.9  8.9 8.7*   GFR: Estimated Creatinine Clearance: 55.7 mL/min (by C-G formula based on SCr of 0.59 mg/dL). Liver Function Tests: No results for input(s): AST, ALT, ALKPHOS, BILITOT, PROT, ALBUMIN in the last 168 hours. No results for input(s): LIPASE, AMYLASE in the last 168 hours. No results for input(s): AMMONIA in the last 168 hours. Coagulation Profile:  Recent Labs Lab 05/19/16 1716  INR 1.11   Cardiac Enzymes: No results for input(s): CKTOTAL, CKMB, CKMBINDEX, TROPONINI in  the last 168 hours. BNP (last 3 results) No results for input(s): PROBNP in the last 8760 hours. HbA1C: No results for input(s): HGBA1C in the last 72 hours. CBG:  Recent Labs Lab 05/19/16 2010 05/19/16 2353 05/20/16 0359 05/20/16 0732 05/20/16 1207  GLUCAP 171* 106* 120* 125* 125*   Lipid Profile: No results for input(s): CHOL, HDL, LDLCALC, TRIG, CHOLHDL, LDLDIRECT in the last 72 hours. Thyroid Function Tests: No results for input(s): TSH, T4TOTAL, FREET4, T3FREE, THYROIDAB in the last 72 hours. Anemia Panel: No results for input(s): VITAMINB12, FOLATE, FERRITIN, TIBC, IRON, RETICCTPCT in the last 72 hours. Urine analysis: No results found for: COLORURINE, APPEARANCEUR, LABSPEC, PHURINE, GLUCOSEU, HGBUR,  BILIRUBINUR, KETONESUR, PROTEINUR, UROBILINOGEN, NITRITE, LEUKOCYTESUR Sepsis Labs: @LABRCNTIP (procalcitonin:4,lacticidven:4)  ) Recent Results (from the past 240 hour(s))  Surgical pcr screen     Status: None   Collection Time: 05/19/16  9:35 PM  Result Value Ref Range Status   MRSA, PCR NEGATIVE NEGATIVE Final   Staphylococcus aureus NEGATIVE NEGATIVE Final    Comment:        The Xpert SA Assay (FDA approved for NASAL specimens in patients over 68 years of age), is one component of a comprehensive surveillance program.  Test performance has been validated by [facility] for patients greater than or equal to 629 year old. It is not intended to diagnose infection nor to guide or monitor treatment.          Radiology Studies: Dg Chest 1 View  Result Date: 05/19/2016 CLINICAL DATA:  Larey SeatFell last night, RIGHT hip injury. Larey SeatFell again today with the RIGHT leg pain in shortening. EXAM: CHEST 1 VIEW COMPARISON:  None available for comparison at time of study interpretation. FINDINGS: Cardiac silhouette is normal. Fullness of LEFT hilum accentuated by rotation to the RIGHT. Calcified aortic knob. No pleural effusions or focal consolidations. Trachea projects midline and there is no pneumothorax. Soft tissue planes and included osseous structures are non-suspicious. IMPRESSION: No acute cardiopulmonary process. Prominent LEFT hilum which could represent vascular structures and/or lymphadenopathy. Electronically Signed   By: Awilda Metroourtnay  Bloomer M.D.   On: 05/19/2016 17:58   Dg Hip Unilat With Pelvis 2-3 Views Right  Result Date: 05/19/2016 CLINICAL DATA:  Fall EXAM: DG HIP (WITH OR WITHOUT PELVIS) 2-3V RIGHT COMPARISON:  None. FINDINGS: There is a markedly displaced subcapital fracture of the right femoral neck. There is foreshortening of the femur. Osteopenia. Left hip hemiarthroplasty is in place. Lumbosacral fusion hardware is in place. Osteopenia. IMPRESSION: Markedly displaced acute right  femoral neck fracture. Electronically Signed   By: Jolaine ClickArthur  Hoss M.D.   On: 05/19/2016 17:51        Scheduled Meds: . cholecalciferol  3,000 Units Oral Daily  . divalproex  125 mg Oral BID  . docusate sodium  100 mg Oral BID  . donepezil  10 mg Oral QHS  . insulin aspart  0-9 Units Subcutaneous Q4H  . memantine  10 mg Oral BID  . sertraline  100 mg Oral Daily   Continuous Infusions:   LOS: 1 day    Time spent: 25 minutes. Greater than 50% of this time was spent in direct contact with the patient coordinating care.     Chaya JanHERNANDEZ ACOSTA,ESTELA, MD [facility] Pager [phone]  If 7PM-7AM, please contact night-coverage [email or web address] Password [email or web address] 05/20/2016, 3:17 PM

## 2016-05-21 ENCOUNTER — Encounter (HOSPITAL_COMMUNITY): Payer: Self-pay | Admitting: Orthopedic Surgery

## 2016-05-21 LAB — CBC
HEMATOCRIT: 32.1 % — AB (ref 36.0–46.0)
HEMOGLOBIN: 10.9 g/dL — AB (ref 12.0–15.0)
MCH: 30.3 pg (ref 26.0–34.0)
MCHC: 34 g/dL (ref 30.0–36.0)
MCV: 89.2 fL (ref 78.0–100.0)
Platelets: 166 10*3/uL (ref 150–400)
RBC: 3.6 MIL/uL — ABNORMAL LOW (ref 3.87–5.11)
RDW: 13 % (ref 11.5–15.5)
WBC: 5.3 10*3/uL (ref 4.0–10.5)

## 2016-05-21 LAB — GLUCOSE, CAPILLARY
Glucose-Capillary: 107 mg/dL — ABNORMAL HIGH (ref 65–99)
Glucose-Capillary: 146 mg/dL — ABNORMAL HIGH (ref 65–99)
Glucose-Capillary: 151 mg/dL — ABNORMAL HIGH (ref 65–99)
Glucose-Capillary: 154 mg/dL — ABNORMAL HIGH (ref 65–99)
Glucose-Capillary: 187 mg/dL — ABNORMAL HIGH (ref 65–99)

## 2016-05-21 LAB — BASIC METABOLIC PANEL
Anion gap: 8 (ref 5–15)
BUN: 16 mg/dL (ref 6–20)
CALCIUM: 8.6 mg/dL — AB (ref 8.9–10.3)
CHLORIDE: 101 mmol/L (ref 101–111)
CO2: 27 mmol/L (ref 22–32)
CREATININE: 0.64 mg/dL (ref 0.44–1.00)
GFR calc Af Amer: >60 mL/min (ref >60)
GFR calc non Af Amer: >60 mL/min (ref >60)
GLUCOSE: 151 mg/dL — AB (ref 65–99)
Potassium: 3.7 mmol/L (ref 3.5–5.1)
Sodium: 136 mmol/L (ref 135–145)

## 2016-05-21 LAB — VITAMIN D 25 HYDROXY (VIT D DEFICIENCY, FRACTURES): VIT D 25 HYDROXY: 43.2 ng/mL (ref 30.0–100.0)

## 2016-05-21 MED ORDER — HYDROCODONE-ACETAMINOPHEN 5-325 MG PO TABS: 1.0000 | ORAL_TABLET | Freq: Four times a day (QID) | ORAL | 0 refills | Status: AC | PRN

## 2016-05-21 MED ORDER — ENSURE ENLIVE PO LIQD: 237.0000 mL | Freq: Three times a day (TID) | ORAL | Status: DC

## 2016-05-21 MED ORDER — SIMETHICONE 40 MG/0.6ML PO SUSP
ORAL | Status: AC
  Filled 2016-05-21: qty 30

## 2016-05-21 MED ORDER — ASPIRIN 325 MG PO TBEC: 325.0000 mg | DELAYED_RELEASE_TABLET | Freq: Every day | ORAL | 0 refills | Status: AC

## 2016-05-21 MED ORDER — DIVALPROEX SODIUM 125 MG PO CSDR
DELAYED_RELEASE_CAPSULE | ORAL | Status: AC
  Filled 2016-05-21: qty 1

## 2016-05-21 MED ORDER — CEFAZOLIN SODIUM-DEXTROSE 2-4 GM/100ML-% IV SOLN
INTRAVENOUS | Status: AC
  Filled 2016-05-21: qty 200

## 2016-05-21 MED ORDER — METHOCARBAMOL 500 MG PO TABS: 500.0000 mg | ORAL_TABLET | Freq: Four times a day (QID) | ORAL | 0 refills | Status: AC | PRN

## 2016-05-21 NOTE — Evaluation (Signed)
Occupational Therapy Evaluation Patient Details Name: Misty KaplanBarbara Mckee MRN: [id number] DOB: [birth year] Today's Date: 05/21/2016    History of Present Illness 70 y.o. female with medical history significant for advanced Alzheimer's dementia, hypertension, depression with anxiety, and type 2 diabetes mellitus who presents from her SNF for evaluation of right hip pain with gross deformity following an unwitnessed fall. History is obtained through discussion with the ED and SNF personnel and review of the EMR as patient is limited by advanced dementia. She had reportedly been evaluated at a [facility] the night prior to this admission after a fall. She reportedly had right hip pain at that time but imaging was reported as negative. She apparently suffered a second fall today and was noted to have shortening and external rotation of the right leg. She complained of right hip pain and was unable to bear weight. She had seemed to be in her usual state prior to these falls.  She sustained a R hip fx, and is now s/p unipolar R hip replacement on 05/21/2016.   She is to be WBAT with direct lateral precautions.     Clinical Impression   Pt awake, alert, daughter in room with pt for PT/OT evaluation. PTA pt required mod-max assist and verbal guidance for ADL completion due to cognition. [name] reports staff at [facility] feed pt as well. Pt pain limited during evaluation, able to perform bed mobility and transfers with assistance from PT. No further acute OT services required at this time as pt requires high level of assistance for ADL tasks at baseline. Recommend SNF at discharge due to need for increased assistance with mobility and functional task completion.     Follow Up Recommendations  SNF    Equipment Recommendations  None recommended by OT       Precautions / Restrictions Precautions Precautions: Fall Required Braces or Orthoses: Other Brace/Splint (abduction wedge when  sleeping) Restrictions Weight Bearing Restrictions: No      Mobility Bed Mobility Overal bed mobility: Needs Assistance Bed Mobility: Supine to Sit     Supine to sit: HOB elevated;Mod assist     General bed mobility comments: Assistance to advance LE's towards the EOB, as well as assistance to lift trunk.    Transfers Overall transfer level: Needs assistance Equipment used: Rolling walker (2 wheeled) Transfers: Sit to/from Stand Sit to Stand: Min assist                   ADL Overall ADL's : Needs assistance/impaired                                       General ADL Comments: Pt requires max assist and guidance for ADL completion at baseline due to cognition               Pertinent Vitals/Pain Pain Assessment: Faces Faces Pain Scale: Hurts even more Pain Location: R hip Pain Intervention(s): Limited activity within patient's tolerance;Monitored during session;Premedicated before session;Repositioned     Hand Dominance Right   Extremity/Trunk Assessment Upper Extremity Assessment Upper Extremity Assessment: Overall WFL for tasks assessed   Lower Extremity Assessment Lower Extremity Assessment: Defer to PT evaluation RLE Deficits / Details: Pt is not able to follow commands for MMT, but demonstrates grossly 3/5 strength with functional mobility.  LLE Deficits / Details: Grossly WFL observed with functional mobility.  Communication Communication Communication: Other (comment) (mumbles; occasionally answers questions/responds)   Cognition Arousal/Alertness: Awake/alert Behavior During Therapy: WFL for tasks assessed/performed Overall Cognitive Status: History of cognitive impairments - at baseline                                Home Living       Type of Home: Assisted living ([facility])                       Home Equipment: Walker - 2 wheels   Additional Comments: Dtr states that she has a RW  from her last hip fx., which was lat year.       Prior Functioning/Environment Level of Independence: Needs assistance  Gait / Transfers Assistance Needed: Pt ambulated independently ADL's / Homemaking Assistance Needed: Pt requires assistance with all ADLs-dressing, bathing, grooming, and eating.             OT Problem List: Decreased cognition;Decreased safety awareness;Decreased knowledge of use of DME or AE;Decreased knowledge of precautions;Pain   OT Treatment/Interventions:      OT Goals(Current goals can be found in the care plan section) Acute Rehab OT Goals Patient Stated Goal: To get back to the [facility]  OT Frequency:             Co-evaluation PT/OT/SLP Co-Evaluation/Treatment: Yes Reason for Co-Treatment: Complexity of the patient's impairments (multi-system involvement);Necessary to address cognition/behavior during functional activity PT goals addressed during session: Mobility/safety with mobility;Balance;Proper use of DME OT goals addressed during session: ADL's and self-care      End of Session Equipment Utilized During Treatment: Gait belt;Rolling walker  Activity Tolerance: Patient tolerated treatment well Patient left: in chair;with call bell/phone within reach;with chair alarm set;with family/visitor present   Time: [id number] OT Time Calculation (min): 16 min Charges:  OT General Charges $OT Visit: 1 Procedure OT Evaluation $OT Eval Low Complexity: 1 Procedure [name], OTR/L  [phone] 05/21/2016, 12:17 PM

## 2016-05-21 NOTE — Anesthesia Postprocedure Evaluation (Signed)
Anesthesia Post Note  Patient: Derwood KaplanBarbara Raymundo  Procedure(s) Performed: Procedure(s) (LRB): BIPOLAR HIP REPLACEMENT RIGHT (Right)  Patient location during evaluation: Nursing Unit Anesthesia Type: Spinal Level of consciousness: awake Pain management: pain level controlled (pain relief overnight with morphine) Vital Signs Assessment: post-procedure vital signs reviewed and stable Respiratory status: spontaneous breathing and patient connected to nasal cannula oxygen Cardiovascular status: stable Postop Assessment: no signs of nausea or vomiting Anesthetic complications: no    Last Vitals:  Vitals:   05/21/16 0330 05/21/16 0725  BP: (!) 112/51 119/73  Pulse: 88 88  Resp: 19 18  Temp: 36.8 C 37.5 C    Last Pain:  Vitals:   05/21/16 0725  TempSrc: Axillary  PainSc:                  ADAMS, AMY A

## 2016-05-21 NOTE — Discharge Summary (Signed)
Physician Discharge Summary  [name] NWG:[id number] DOB: 02/22/1946 DOA: 05/19/2016  PCP: No PCP Per Patient  Admit date: 05/19/2016 Discharge date: 05/21/2016  Time spent: 45 minutes  Recommendations for Outpatient Follow-up:  -Will be discharged to SNF today for post hip fracture rehab.   Discharge Diagnoses:  Principal Problem:   Closed displaced fracture of right femoral neck (HCC) Active Problems:   Dementia   Hypertension   Depression with anxiety   Non-insulin dependent type 2 diabetes mellitus (HCC)   Closed right hip fracture, initial encounter (HCC)   Malnutrition of moderate degree   Discharge Condition: Stable and improved  Filed Weights   05/19/16 1551 05/19/16 2011 05/20/16 1556  Weight: 56.7 kg (125 lb) 54.8 kg (120 lb 13 oz) 54.4 kg (120 lb)    History of present illness:  As per Dr. Antionette Char on 11/28: [name] is a 70 y.o. female with medical history significant for advanced Alzheimer's dementia, hypertension, depression with anxiety, and type 2 diabetes mellitus who presents from her SNF for evaluation of right hip pain with gross deformity following an unwitnessed fall. History is obtained through discussion with the ED and SNF personnel and review of the EMR as patient is limited by advanced dementia. She had reportedly been evaluated at a [facility] the night prior to this admission after a fall. She reportedly had right hip pain at that time but imaging was reported as negative. She apparently suffered a second fall today and was noted to have shortening and external rotation of the right leg. She complained of right hip pain and was unable to bear weight. She had seemed to be in her usual state prior to these falls with no recent fevers or chills and no recent cough, vomiting, or diarrhea. Other than right hip pain, the patient has not voiced any complaints. There has been no apparent head injury and no loss of consciousness has been  reported. The patient is not anticoagulated. Surgical history, if any, is unknown.  ED Course: Upon arrival to the ED, patient is found to be afebrile, saturating well on room air, and with vitals otherwise stable. EKG demonstrates a sinus rhythm with low voltage QRS and chest x-ray is negative for acute cardiopulmonary disease. Radiographs of the hip demonstrate a markedly displaced acute right femoral neck fracture. Chemistry panel is notable for an elevated BUN to creatinine ratio and CBC is unremarkable. INR is within the normal limits and urinalysis remains pending. Type and screen was performed in the emergency department and orthopedic surgery was consulted by the ED physician. The orthopedist recommends a medical admission to independent hospital and will plan to see the patient in the morning. Patient was treated with fentanyl in the ED, remained hemodynamically stable and in no respiratory distress, and will be admitted to the medical-surgical unit for ongoing evaluation and management of acute displaced fracture of the right hip.   Hospital Course:   Right Hip Fracture -For OR 11/30. -Will need SNF afterwards for ST rehab.  Dementia -At baseline.  HTN -Fair control. -Continue home meds  DM II -Well controlled.  Procedures:  Right Hip repair 11/29   Consultations:  Ortho, Dr. Romeo Apple  Discharge Instructions  Discharge Instructions    Diet - low sodium heart healthy    Complete by:  As directed    Increase activity slowly    Complete by:  As directed        Medication List    STOP taking these  medications   ALPRAZolam 0.25 MG tablet Commonly known as:  XANAX   CULTURELLE Caps     TAKE these medications   aspirin 325 MG EC tablet Take 1 tablet (325 mg total) by mouth daily with breakfast. Start taking on:  05/22/2016   cholecalciferol 1000 units tablet Commonly known as:  VITAMIN D Take 3,000 Units by mouth daily.   divalproex 125 MG  capsule Commonly known as:  DEPAKOTE SPRINKLE Take 125 mg by mouth 2 (two) times daily.   donepezil 10 MG tablet Commonly known as:  ARICEPT Take 10 mg by mouth at bedtime.   HYDROcodone-acetaminophen 5-325 MG tablet Commonly known as:  NORCO/VICODIN Take 1-2 tablets by mouth every 6 (six) hours as needed for moderate pain.   lisinopril 5 MG tablet Commonly known as:  PRINIVIL,ZESTRIL Take 5 mg by mouth daily.   memantine 10 MG tablet Commonly known as:  NAMENDA Take 10 mg by mouth 2 (two) times daily.   metFORMIN 500 MG tablet Commonly known as:  GLUCOPHAGE Take 500 mg by mouth 2 (two) times daily.   methocarbamol 500 MG tablet Commonly known as:  ROBAXIN Take 1 tablet (500 mg total) by mouth every 6 (six) hours as needed for muscle spasms.   mupirocin ointment 2 % Commonly known as:  BACTROBAN Apply 1 application topically every 12 (twelve) hours.   sertraline 100 MG tablet Commonly known as:  ZOLOFT Take 100 mg by mouth daily.      No Known Allergies    The results of significant diagnostics from this hospitalization (including imaging, microbiology, ancillary and laboratory) are listed below for reference.    Significant Diagnostic Studies: Dg Chest 1 View  Result Date: 05/19/2016 CLINICAL DATA:  Larey SeatFell last night, RIGHT hip injury. Larey SeatFell again today with the RIGHT leg pain in shortening. EXAM: CHEST 1 VIEW COMPARISON:  None available for comparison at time of study interpretation. FINDINGS: Cardiac silhouette is normal. Fullness of LEFT hilum accentuated by rotation to the RIGHT. Calcified aortic knob. No pleural effusions or focal consolidations. Trachea projects midline and there is no pneumothorax. Soft tissue planes and included osseous structures are non-suspicious. IMPRESSION: No acute cardiopulmonary process. Prominent LEFT hilum which could represent vascular structures and/or lymphadenopathy. Electronically Signed   By: Awilda Metroourtnay  Bloomer M.D.   On:  05/19/2016 17:58   Dg Pelvis 1-2 Views  Result Date: 05/20/2016 CLINICAL DATA:  70 y/o  F; right hip replacement. EXAM: PELVIS - 1-2 VIEW COMPARISON:  05/19/2016 pelvis and right hip radiographs. FINDINGS: Air and soft tissue swelling of right upper thigh and lateral skin staples compatible postsurgical changes. Right hip hemi arthroplasty well-seated without apparent periprosthetic lucency or fracture. The left hip hemi arthroplasty is unchanged. Lumbar spine fusion hardware noted. Vascular calcifications. Foley catheter in situ. IMPRESSION: Status post right hip hemi arthroplasty without apparent hardware related complications and expected postsurgical soft tissue changes. Electronically Signed   By: Mitzi HansenLance  Furusawa-Stratton M.D.   On: 05/20/2016 20:13   Dg Hip Unilat With Pelvis 2-3 Views Right  Result Date: 05/19/2016 CLINICAL DATA:  Fall EXAM: DG HIP (WITH OR WITHOUT PELVIS) 2-3V RIGHT COMPARISON:  None. FINDINGS: There is a markedly displaced subcapital fracture of the right femoral neck. There is foreshortening of the femur. Osteopenia. Left hip hemiarthroplasty is in place. Lumbosacral fusion hardware is in place. Osteopenia. IMPRESSION: Markedly displaced acute right femoral neck fracture. Electronically Signed   By: Jolaine ClickArthur  Hoss M.D.   On: 05/19/2016 17:51    Microbiology: Recent  Results (from the past 240 hour(s))  Surgical pcr screen     Status: None   Collection Time: 05/19/16  9:35 PM  Result Value Ref Range Status   MRSA, PCR NEGATIVE NEGATIVE Final   Staphylococcus aureus NEGATIVE NEGATIVE Final    Comment:        The Xpert SA Assay (FDA approved for NASAL specimens in patients over 68 years of age), is one component of a comprehensive surveillance program.  Test performance has been validated by [facility] for patients greater than or equal to 68 year old. It is not intended to diagnose infection nor to guide or monitor treatment.      Labs: Basic Metabolic  Panel:  Recent Labs Lab 05/19/16 1716 05/20/16 0441 05/21/16 0448  NA 135 135 136  K 3.6 3.7 3.7  CL 98* 99* 101  CO2 29 28 27   GLUCOSE 158* 122* 151*  BUN 21* 18 16  CREATININE 0.63 0.59 0.64  CALCIUM 8.9  8.9 8.7* 8.6*   Liver Function Tests: No results for input(s): AST, ALT, ALKPHOS, BILITOT, PROT, ALBUMIN in the last 168 hours. No results for input(s): LIPASE, AMYLASE in the last 168 hours. No results for input(s): AMMONIA in the last 168 hours. CBC:  Recent Labs Lab 05/19/16 1716 05/21/16 0448  WBC 7.7 5.3  NEUTROABS 6.7  --   HGB 12.4 10.9*  HCT 37.6 32.1*  MCV 89.5 89.2  PLT 152 166   Cardiac Enzymes: No results for input(s): CKTOTAL, CKMB, CKMBINDEX, TROPONINI in the last 168 hours. BNP: BNP (last 3 results) No results for input(s): BNP in the last 8760 hours.  ProBNP (last 3 results) No results for input(s): PROBNP in the last 8760 hours.  CBG:  Recent Labs Lab 05/20/16 2129 05/21/16 0026 05/21/16 0424 05/21/16 0721 05/21/16 1104  GLUCAP 127* 151* 154* 146* 187*       Signed:  HERNANDEZ ACOSTA,Barrie Sigmund  [facility] Pager: [phone] 05/21/2016, 12:16 PM

## 2016-05-21 NOTE — Addendum Note (Signed)
Addendum  created 05/21/16 0756 by Earleen NewportAmy A Phuong Moffatt, CRNA   Sign clinical note

## 2016-05-21 NOTE — Evaluation (Signed)
Physical Therapy Evaluation Patient Details Name: Misty KaplanBarbara Mckee MRN: [id number] DOB: 1946/04/03 Today's Date: 05/21/2016   History of Present Illness  70 y.o. female with medical history significant for advanced Alzheimer's dementia, hypertension, depression with anxiety, and type 2 diabetes mellitus who presents from her SNF for evaluation of right hip pain with gross deformity following an unwitnessed fall. History is obtained through discussion with the ED and SNF personnel and review of the EMR as patient is limited by advanced dementia. She had reportedly been evaluated at a [facility] the night prior to this admission after a fall. She reportedly had right hip pain at that time but imaging was reported as negative. She apparently suffered a second fall today and was noted to have shortening and external rotation of the right leg. She complained of right hip pain and was unable to bear weight. She had seemed to be in her usual state prior to these falls.  She sustained a R hip fx, and is now s/p unipolar R hip replacement on 05/21/2016.   She is to be WBAT with direct lateral precautions.      Clinical Impression  Pt received in bed, dtr present, and pt is agreeable to PT evaluation.  Dtr gives PLOF information, and expressed that pt is normally independent with ambulation, but requires assistance for dressing, bathing, and feeding.  [facility] at [facility].  During PT evaluation, she required Mod A for supine<>sit, and Min A for sit<>stand.  Pt was able to ambulate 3620ft with RW and Min A.  She is recommended for SNF at this time due to need for increased level of assistance for basic functional mobility.      Follow Up Recommendations SNF    Equipment Recommendations  None recommended by PT    Recommendations for Other Services       Precautions / Restrictions Precautions Precautions: Fall Required Braces or Orthoses: Other Brace/Splint (Abduction brace when  sleeping. ) Restrictions Weight Bearing Restrictions: No      Mobility  Bed Mobility Overal bed mobility: Needs Assistance Bed Mobility: Supine to Sit     Supine to sit: HOB elevated;Mod assist     General bed mobility comments: Assistance to advance LE's towards the EOB, as well as assistance to lift trunk.    Transfers Overall transfer level: Needs assistance Equipment used: Rolling walker (2 wheeled) Transfers: Sit to/from Stand Sit to Stand: Min assist            Ambulation/Gait Ambulation/Gait assistance: Min assist Ambulation Distance (Feet): 20 Feet Assistive device: Rolling walker (2 wheeled) Gait Pattern/deviations: Step-to pattern;Shuffle   Gait velocity interpretation: <1.8 ft/sec, indicative of risk for recurrent falls General Gait Details: Pt requires assistance to advance RW  Stairs            Wheelchair Mobility    Modified Rankin (Stroke Patients Only)       Balance Overall balance assessment: History of Falls;Needs assistance Sitting-balance support: Bilateral upper extremity supported;Feet supported Sitting balance-Leahy Scale: Fair   Postural control: Posterior lean   Standing balance-Leahy Scale: Poor                               Pertinent Vitals/Pain Pain Assessment: Faces Faces Pain Scale: Hurts even more Pain Location: R hip - pt is not able to describe Pain Intervention(s): Limited activity within patient's tolerance;Monitored during session;Premedicated before session;Repositioned    Home Living  Type of Home: Assisted living [facility]         Home Equipment: Dan HumphreysWalker - 2 wheels Additional Comments: Dtr states that she has a RW from her last hip fx., which was lat year.     Prior Function Level of Independence: Needs assistance   Gait / Transfers Assistance Needed: Pt ambulated independently  ADL's / Homemaking Assistance Needed: Pt requires assistance with dressing, bathing, and eating.          Hand Dominance        Extremity/Trunk Assessment   Upper Extremity Assessment: Defer to OT evaluation           Lower Extremity Assessment: RLE deficits/detail;LLE deficits/detail RLE Deficits / Details: Pt is not able to follow commands for MMT, but demonstrates grossly 3/5 strength with functional mobility.  LLE Deficits / Details: Grossly WFL observed with functional mobility.      Communication   Communication: Other (comment) (Mostly garbled and jumbled sounds with intermittent words.  Occasionally appropriate with conversation. )  Cognition Arousal/Alertness: Awake/alert Behavior During Therapy: WFL for tasks assessed/performed Overall Cognitive Status: History of cognitive impairments - at baseline                      General Comments      Exercises     Assessment/Plan    PT Assessment Patient needs continued PT services  PT Problem List Decreased strength;Decreased activity tolerance;Decreased balance;Decreased mobility;Decreased coordination;Decreased cognition;Decreased knowledge of use of DME;Decreased safety awareness;Decreased knowledge of precautions;Pain;Decreased skin integrity          PT Treatment Interventions DME instruction;Gait training;Functional mobility training;Therapeutic activities;Therapeutic exercise;Balance training;Patient/family education    PT Goals (Current goals can be found in the Care Plan section)  Acute Rehab PT Goals Patient Stated Goal: To get back to the [facility] PT Goal Formulation: With patient/family Time For Goal Achievement: 05/28/16 Potential to Achieve Goals: Fair    Frequency 7X/week   Barriers to discharge        Co-evaluation PT/OT/SLP Co-Evaluation/Treatment: Yes Reason for Co-Treatment: Necessary to address cognition/behavior during functional activity PT goals addressed during session: Mobility/safety with mobility;Balance;Proper use of DME         End of Session Equipment  Utilized During Treatment: Gait belt Activity Tolerance: Patient tolerated treatment well Patient left: in chair;with call bell/phone within reach;with chair alarm set;with family/visitor present Nurse Communication: Mobility status Misty Stanley(Lisa, RN notified of pt's mobiltiy status, and pt's location at end of tx.  Mobility sheet placed in pt's room. )    Functional Assessment Tool Used: [employer] AM-PAC "6-clicks"  Functional Limitation: Mobility: Walking and moving around Mobility: Walking and Moving Around Current Status [phone](G8978): At least 40 percent but less than 60 percent impaired, limited or restricted Mobility: Walking and Moving Around Goal Status [phone](G8979): At least 20 percent but less than 40 percent impaired, limited or restricted    Time: 0852-0921 PT Time Calculation (min) (ACUTE ONLY): 29 min   Charges:   PT Evaluation $PT Eval Low Complexity: 1 Procedure     PT G Codes:   PT G-Codes **NOT FOR INPATIENT CLASS** Functional Assessment Tool Used: [employer] AM-PAC "6-clicks"  Functional Limitation: Mobility: Walking and moving around Mobility: Walking and Moving Around Current Status [phone](G8978): At least 40 percent but less than 60 percent impaired, limited or restricted Mobility: Walking and Moving Around Goal Status [phone](G8979): At least 20 percent but less than 40 percent impaired, limited or restricted    [facility] Health[facility]eth  Chaniqua Brisby, PT, DPT X: [phone]

## 2016-05-21 NOTE — Clinical Social Work Placement (Signed)
   CLINICAL SOCIAL WORK PLACEMENT  NOTE  Date:  05/21/2016  Patient Details  Name: [name] MRN: [id number] Date of Birth: 11-08-1945  Clinical Social Work is seeking post-discharge placement for this patient at the Skilled  Nursing Facility level of care (*CSW will initial, date and re-position this form in  chart as items are completed):  Yes   Patient/family provided with [facility] Department's list of facilities offering this level of care within the geographic area requested by the patient (or if unable, by the patient's family).  Yes   Patient/family informed of their freedom to choose among providers that offer the needed level of care, that participate in Medicare, Medicaid or managed care program needed by the patient, have an available bed and are willing to accept the patient.  Yes   Patient/family informed of [facility]'s ownership interest in [facility] and [facility], as well as of the fact that they are under no obligation to receive care at these facilities.  PASRR submitted to EDS on       PASRR number received on       Existing PASRR number confirmed on 05/20/16     FL2 transmitted to all facilities in geographic area requested by pt/family on 05/20/16     FL2 transmitted to all facilities within larger geographic area on       Patient informed that his/her managed care company has contracts with or will negotiate with certain facilities, including the following:        Yes   Patient/family informed of bed offers received.  [facility]     Physician recommends and patient chooses bed at      [facility] on 05/21/16.  Patient to be transferred to facility by facility [facility]     Patient family notified on 05/21/16 of transfer.  Name of family member notified:  Rosey Batheresa- daughter     PHYSICIAN       Additional Comment:     _______________________________________________ Karn CassisStultz, Noora Locascio [place], LCSW 05/21/2016, 12:41 PM [phone]

## 2016-05-21 NOTE — Care Management Note (Signed)
Case Management Note  Patient Details  Name: Derwood KaplanBarbara Novelo MRN: [id number] Date of Birth: 1946-02-11     Expected Discharge Date:      05/21/2016          Expected Discharge Plan:  Assisted Living / Rest Home  In-House Referral:  Clinical Social Work  Discharge planning Services  CM Consult  Post Acute Care Choice:    Choice offered to:     DME Arranged:    DME Agency:     HH Arranged:    HH Agency:     Status of Service:  Completed, signed off  If discussed at [employer] Length of Stay Meetings, dates discussed:  Patient discharging today to SNF. CSW made arrangements. No CM needs.  Additional Comments:  Anu Stagner, Chrystine OilerSharley Diane, RN 05/21/2016, 2:32 PM

## 2016-05-23 LAB — TYPE AND SCREEN
BLOOD PRODUCT EXPIRATION DATE: 201712022359
BLOOD PRODUCT EXPIRATION DATE: 201712022359
UNIT TYPE AND RH: 6200
Unit Type and Rh: 6200

## 2016-05-25 NOTE — Progress Notes (Signed)
Discharged to [facility] in stable condition, out via w/c with transporter and daughter.

## 2016-11-30 ENCOUNTER — Other Ambulatory Visit (HOSPITAL_COMMUNITY): Payer: Self-pay | Admitting: Hematology & Oncology

## 2016-11-30 DIAGNOSIS — Z1231 Encounter for screening mammogram for malignant neoplasm of breast: Secondary | ICD-10-CM

## 2016-11-30 DIAGNOSIS — R634 Abnormal weight loss: Secondary | ICD-10-CM

## 2016-12-14 ENCOUNTER — Ambulatory Visit (HOSPITAL_COMMUNITY)
Admission: RE | Admit: 2016-12-14 | Discharge: 2016-12-14 | Disposition: A | Discharge status: Home / Self Care | Payer: Medicare Other | Source: Ambulatory Visit | Attending: Hematology & Oncology | Admitting: Hematology & Oncology

## 2016-12-14 DIAGNOSIS — J90 Pleural effusion, not elsewhere classified: Secondary | ICD-10-CM | POA: Insufficient documentation

## 2016-12-14 DIAGNOSIS — Z96643 Presence of artificial hip joint, bilateral: Secondary | ICD-10-CM | POA: Diagnosis not present

## 2016-12-14 DIAGNOSIS — R161 Splenomegaly, not elsewhere classified: Secondary | ICD-10-CM | POA: Insufficient documentation

## 2016-12-14 DIAGNOSIS — I2510 Atherosclerotic heart disease of native coronary artery without angina pectoris: Secondary | ICD-10-CM | POA: Diagnosis not present

## 2016-12-14 DIAGNOSIS — Z1231 Encounter for screening mammogram for malignant neoplasm of breast: Secondary | ICD-10-CM

## 2016-12-14 DIAGNOSIS — R634 Abnormal weight loss: Secondary | ICD-10-CM

## 2016-12-14 DIAGNOSIS — I700 Atherosclerosis of aorta: Secondary | ICD-10-CM | POA: Insufficient documentation

## 2016-12-14 DIAGNOSIS — K449 Diaphragmatic hernia without obstruction or gangrene: Secondary | ICD-10-CM | POA: Insufficient documentation

## 2016-12-14 DIAGNOSIS — Z981 Arthrodesis status: Secondary | ICD-10-CM | POA: Insufficient documentation

## 2016-12-14 LAB — POCT I-STAT CREATININE: CREATININE: 0.7 mg/dL (ref 0.44–1.00)

## 2016-12-14 MED ORDER — IOPAMIDOL (ISOVUE-300) INJECTION 61%
100.0000 mL | Freq: Once | INTRAVENOUS | Status: AC | PRN
  Administered 2016-12-14: 100 mL via INTRAVENOUS

## 2017-05-22 DEATH — deceased

## 2018-10-23 IMAGING — CT CT CHEST W/ CM
2 of 5 series · 12 of 36 positions shown, 15 images · IV contrast (Isovue)
Comparison: No comparison CT.  05/19/2016 chest x-ray.

CLINICAL DATA: 71-year-old diabetic female with Alzheimer's
disease, hypertension, weight loss and muscle weakness. Cough.
Initial encounter.

EXAM:
CT CHEST, ABDOMEN, AND PELVIS WITHOUT AND WITH CONTRAST
TECHNIQUE: Multidetector CT imaging of the chest, abdomen and pelvis was
performed following the standard protocol before and during bolus
administration of intravenous contrast.
CONTRAST:  100mL AR94KB-UFF IOPAMIDOL (AR94KB-UFF) INJECTION 61%

[Series 2: cap with · axial · 0.70mm/px · z∈[-617,-72]mm · 9 of 137 slices shown, 12 images]
[im 14/137  mediastinal]
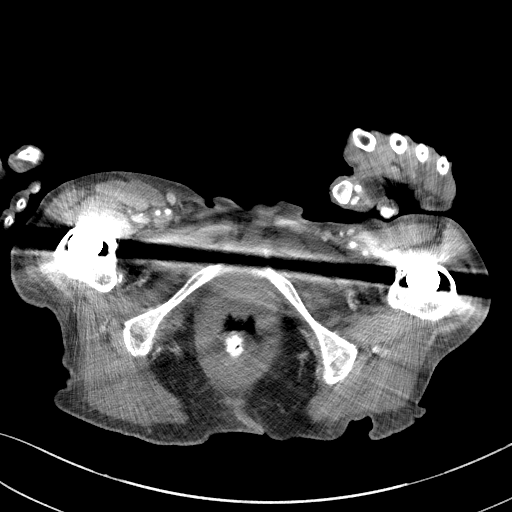
[im 14/137  lung]
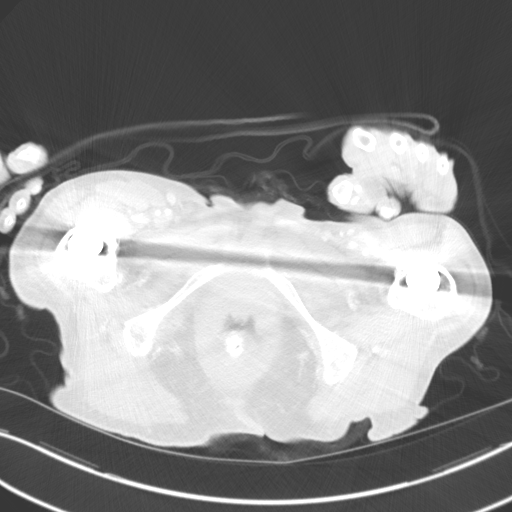
[im 28/137  lung]
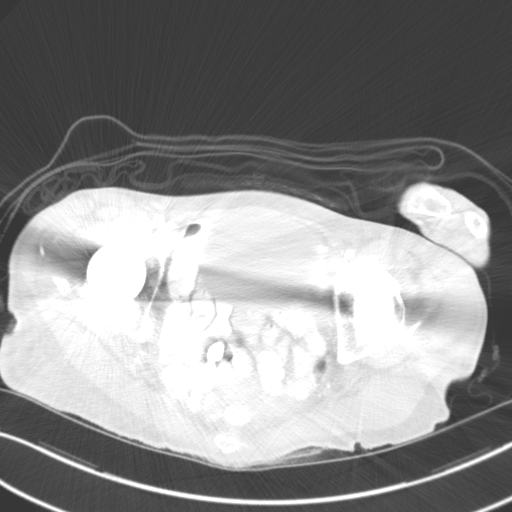
[im 41/137  lung]
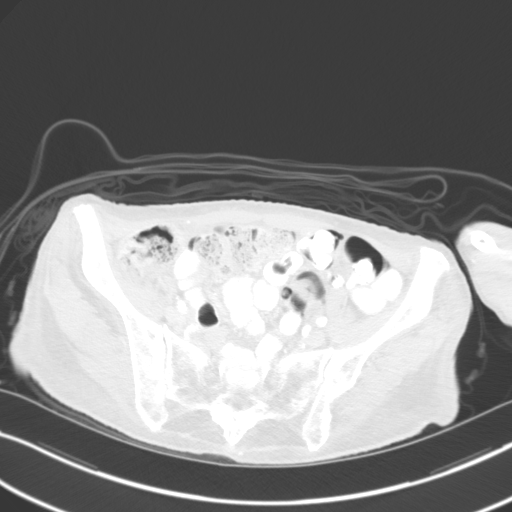
[im 55/137  lung]
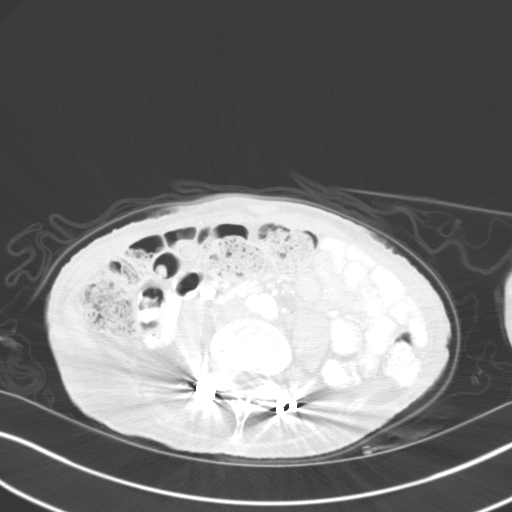
[im 69/137  mediastinal]
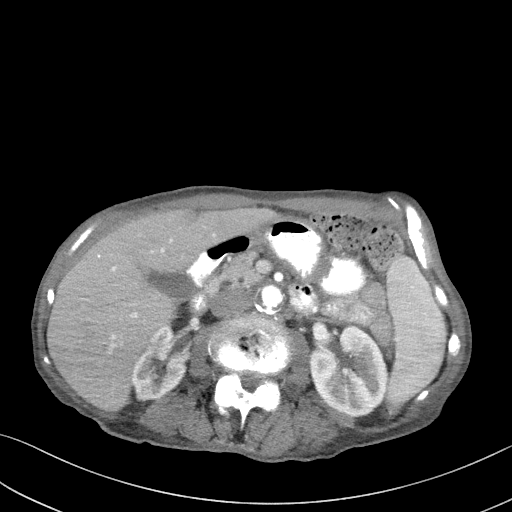
[im 69/137  lung]
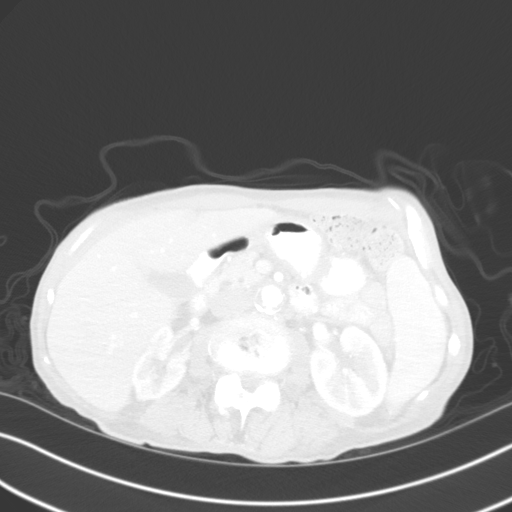
[im 82/137  lung]
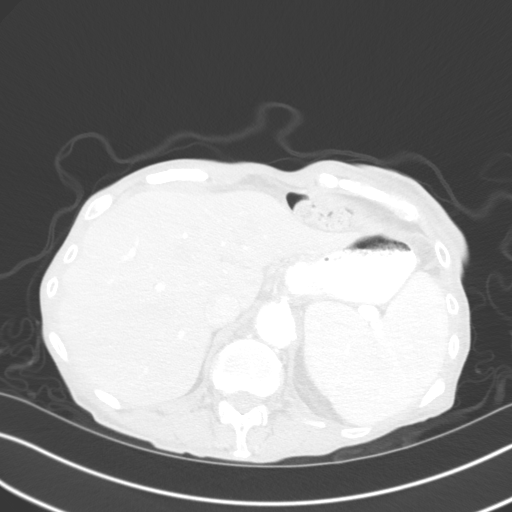
[im 96/137  lung]
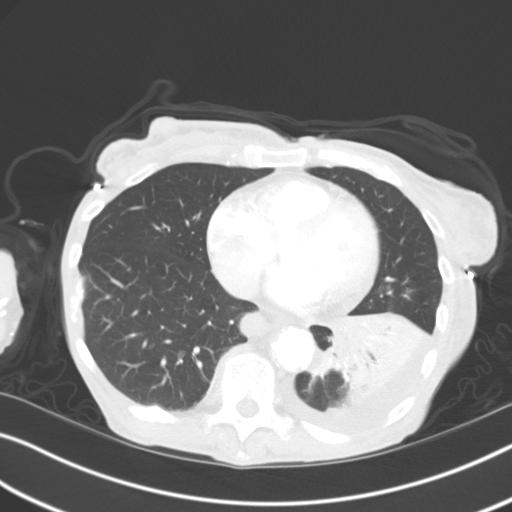
[im 109/137  lung]
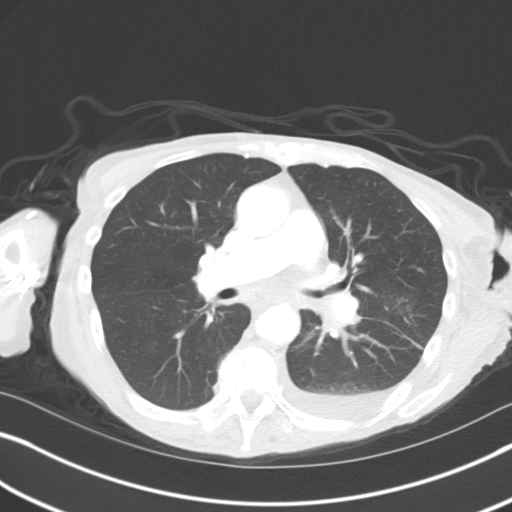
[im 123/137  mediastinal]
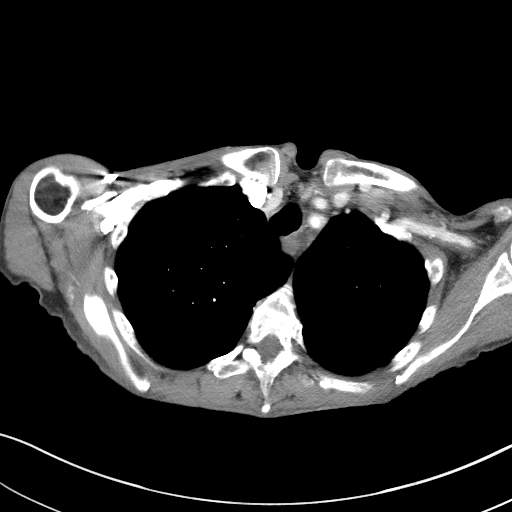
[im 123/137  lung]
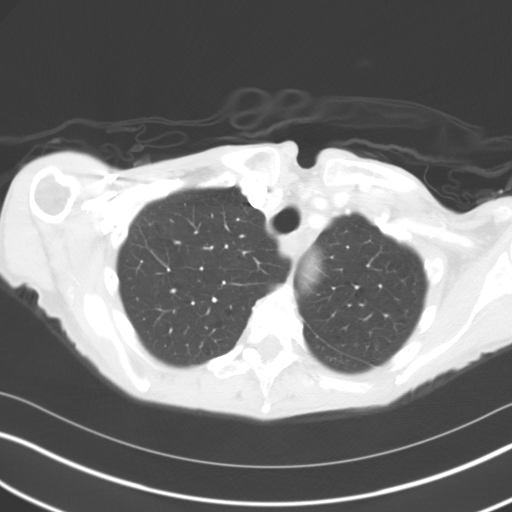

[Series 3: coronals · coronal · 0.69mm/px · 3 of 119 slices shown]
[im 24/119  lung]
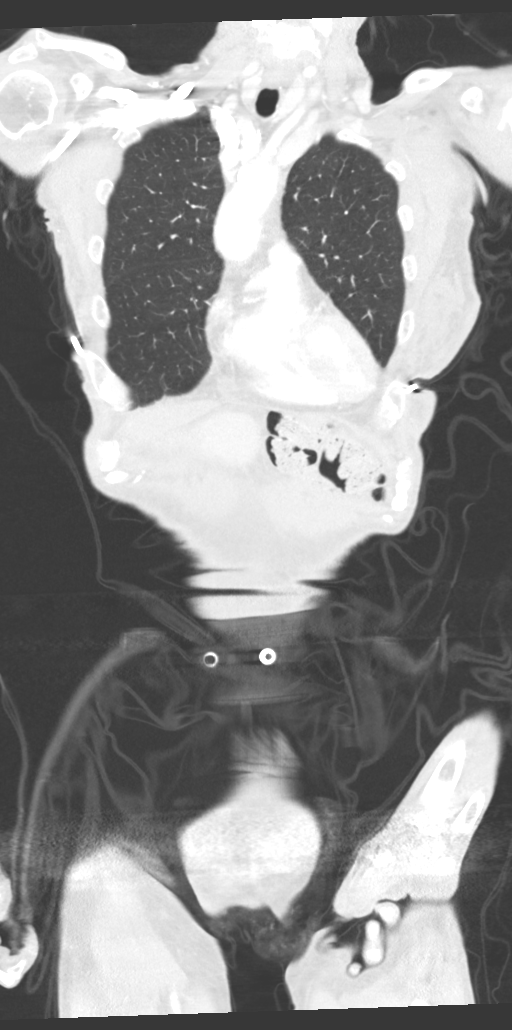
[im 48/119  lung]
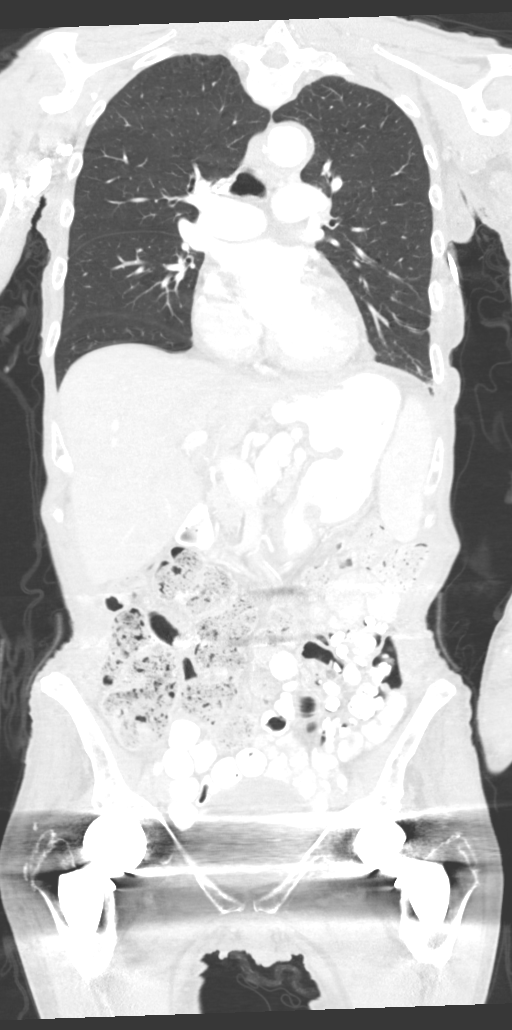
[im 71/119  lung]
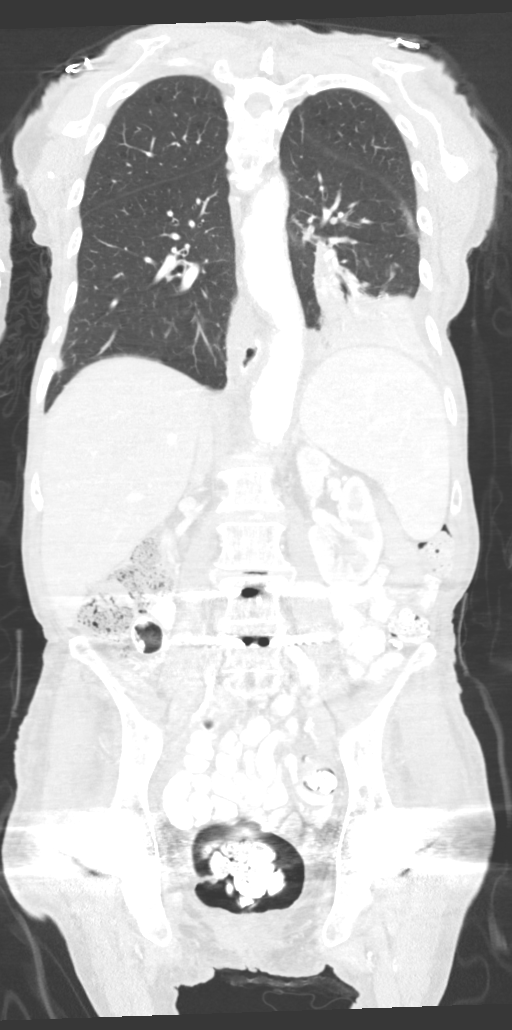

[12 of 36 positions shown; findings below may reference images not displayed]

FINDINGS: CT CHEST FINDINGS

Cardiovascular: Heart size top-normal. Coronary artery
calcifications. Calcified aortic valve. Calcified plaque thoracic
aorta, great vessels and carotid bifurcation. Trace pericardial
fluid. No central pulmonary embolus.

Mediastinum/Nodes: Moderate-size hiatal hernia with thickening of
the esophagus which may be related to reflux. Left hilar matted
lymph nodes.

Lungs/Pleura: Left lower lobe consolidation with left-sided pleural
effusion. Consolidation left lower lobe bronchus. It is possible
this represents mucous plug although obstructing mass not excluded.

Remainder of visualized pulmonary parenchyma changes suggestive of
scarring/reactive changes.

Musculoskeletal: Kyphoscoliosis. Remote T12 anterior wedge
compression deformity. Remote rib fractures more notable on the
right.

CT ABDOMEN PELVIS FINDINGS

Hepatobiliary: No worrisome hepatic lesion. No calcified gallstones.

Pancreas: No pancreatic mass.

Spleen: No splenic mass.  Spleen is enlarged spanning over 13.4 cm.

Adrenals/Urinary Tract: Right kidney is atrophic. Both kidneys
function without hydronephrosis. Bilateral renal cysts larger on the
right. No worrisome renal or adrenal lesion. Evaluation of urinary
bladder limited by streak artifact from hip replacements.

Stomach/Bowel: Evaluation limited by lack of fat planes in streak
artifact from hip replacements. No obvious abnormality.

Vascular/Lymphatic: Irregular plaque throughout the abdominal aorta
without large vessel occlusion. Mild ectasia without aneurysm. No
adenopathy.

Reproductive: Evaluation limited by streak artifact from hip
replacements.

Other: No free air or bowel containing hernia. Dense breast
parenchyma.

Musculoskeletal: Prior fusion L4-5 and L5-S1 and bilateral hip
replacement causing artifact.
IMPRESSION: Left lower lobe consolidation with left-sided pleural effusion.
Consolidation left lower lobe bronchus. It is possible this
represents a mucous plug although obstructing mass not excluded.

Left hilar matted lymph nodes may be reactive in origin rather than
malignant adenopathy.

Aortic atherosclerosis.

Coronary artery calcification. Irregular calcified plaque throughout
the thoracic and abdominal aorta as well as involving branch
vessels.

Spleen is enlarged spanning over 13.4 cm.

Moderate-size hiatal hernia with thickening of the esophagus which
may be related to reflux.

Remote T12 anterior wedge compression deformity. Remote rib
fractures more notable on the right.

Prior fusion L4-5 and L5-S1 and bilateral hip replacement causing
artifact.

These results will be called to the ordering clinician or
representative by the Radiologist Assistant, and communication
documented in the PACS or zVision Dashboard.
